# Patient Record
Sex: Male | Born: 1970 | Race: White | Hispanic: No | Smoking: Never smoker
Health system: Southern US, Community
[De-identification: ages and names within clinical notes are randomized; demographics above are authoritative.]

## PROBLEM LIST (undated history)

## (undated) DIAGNOSIS — B169 Acute hepatitis B without delta-agent and without hepatic coma: Secondary | ICD-10-CM

## (undated) DIAGNOSIS — E782 Mixed hyperlipidemia: Secondary | ICD-10-CM

## (undated) DIAGNOSIS — I10 Essential (primary) hypertension: Secondary | ICD-10-CM

## (undated) DIAGNOSIS — E291 Testicular hypofunction: Secondary | ICD-10-CM

## (undated) DIAGNOSIS — F5101 Primary insomnia: Secondary | ICD-10-CM

## (undated) DIAGNOSIS — F1020 Alcohol dependence, uncomplicated: Secondary | ICD-10-CM

## (undated) HISTORY — DX: Essential (primary) hypertension: I10

## (undated) HISTORY — DX: Acute hepatitis B without delta-agent and without hepatic coma: B16.9

## (undated) HISTORY — DX: Mixed hyperlipidemia: E78.2

## (undated) HISTORY — DX: Primary insomnia: F51.01

## (undated) HISTORY — DX: Alcohol dependence, uncomplicated: F10.20

## (undated) HISTORY — PX: HERNIA REPAIR: SHX51

## (undated) HISTORY — DX: Testicular hypofunction: E29.1

---

## 1997-12-14 ENCOUNTER — Emergency Department (HOSPITAL_COMMUNITY): Admission: EM | Admit: 1997-12-14 | Discharge: 1997-12-14 | Payer: Self-pay | Admitting: Emergency Medicine

## 2017-11-06 DIAGNOSIS — E782 Mixed hyperlipidemia: Secondary | ICD-10-CM | POA: Diagnosis not present

## 2017-11-06 DIAGNOSIS — E291 Testicular hypofunction: Secondary | ICD-10-CM | POA: Diagnosis not present

## 2018-06-03 DIAGNOSIS — E782 Mixed hyperlipidemia: Secondary | ICD-10-CM | POA: Diagnosis not present

## 2018-06-03 DIAGNOSIS — E291 Testicular hypofunction: Secondary | ICD-10-CM | POA: Diagnosis not present

## 2018-06-03 DIAGNOSIS — R740 Nonspecific elevation of levels of transaminase and lactic acid dehydrogenase [LDH]: Secondary | ICD-10-CM | POA: Diagnosis not present

## 2018-06-03 DIAGNOSIS — R030 Elevated blood-pressure reading, without diagnosis of hypertension: Secondary | ICD-10-CM | POA: Diagnosis not present

## 2018-06-24 DIAGNOSIS — Z205 Contact with and (suspected) exposure to viral hepatitis: Secondary | ICD-10-CM | POA: Diagnosis not present

## 2018-12-10 DIAGNOSIS — M25512 Pain in left shoulder: Secondary | ICD-10-CM | POA: Diagnosis not present

## 2018-12-10 DIAGNOSIS — I10 Essential (primary) hypertension: Secondary | ICD-10-CM | POA: Diagnosis not present

## 2018-12-10 DIAGNOSIS — E782 Mixed hyperlipidemia: Secondary | ICD-10-CM | POA: Diagnosis not present

## 2018-12-10 DIAGNOSIS — E291 Testicular hypofunction: Secondary | ICD-10-CM | POA: Diagnosis not present

## 2019-10-05 ENCOUNTER — Other Ambulatory Visit: Payer: Self-pay

## 2019-10-19 ENCOUNTER — Other Ambulatory Visit: Payer: Self-pay

## 2019-11-06 ENCOUNTER — Other Ambulatory Visit: Payer: Self-pay

## 2019-11-06 MED ORDER — TERBINAFINE HCL 250 MG PO TABS: 250.0000 mg | ORAL_TABLET | Freq: Every day | ORAL | 0 refills | Status: DC

## 2019-11-06 MED ORDER — LISINOPRIL 5 MG PO TABS: 5.0000 mg | ORAL_TABLET | Freq: Every day | ORAL | 0 refills | Status: DC

## 2019-11-10 ENCOUNTER — Other Ambulatory Visit: Payer: Self-pay

## 2019-11-10 MED ORDER — ZOLPIDEM TARTRATE 10 MG PO TABS: 10.0000 mg | ORAL_TABLET | Freq: Every evening | ORAL | 0 refills | Status: DC | PRN

## 2019-11-10 NOTE — Telephone Encounter (Signed)
I believe pt was abstracted incorrectly. His name is David Murphy. Please reenter. Pt is also due for 6 month follow up in April 2021. I will send one month of rx, but then needs an appt. Thanks, [facility]

## 2019-12-08 ENCOUNTER — Encounter: Payer: Self-pay | Admitting: Family Medicine

## 2019-12-08 DIAGNOSIS — I10 Essential (primary) hypertension: Secondary | ICD-10-CM | POA: Insufficient documentation

## 2019-12-08 DIAGNOSIS — B169 Acute hepatitis B without delta-agent and without hepatic coma: Secondary | ICD-10-CM | POA: Insufficient documentation

## 2019-12-08 DIAGNOSIS — E782 Mixed hyperlipidemia: Secondary | ICD-10-CM | POA: Insufficient documentation

## 2019-12-08 DIAGNOSIS — E291 Testicular hypofunction: Secondary | ICD-10-CM | POA: Insufficient documentation

## 2019-12-08 NOTE — Progress Notes (Signed)
Subjective:  Patient ID: David Murphy, male    DOB: Jan 05, 1971  Age: 49 y.o. MRN: [id number]  Chief Complaint  Patient presents with  . Hypertension  . Hyperlipidemia   HPI Pt presents with hyperlipidemia.  Date of diagnosis 07/21/2015.  Current treatment includes Lipitor.  Compliance with treatment has been good; he takes his medication as directed, maintains his low cholesterol diet, follows up as directed, and maintains his exercise regimen.  He denies experiencing any hypercholesterolemia related symptoms.  Most recent lab tests include total cholesterol level ( fasting 189 mg/dl on 2/67/1245 ), triglycerides ( 141 mg/dl ), HDL ( 51 mg/dL ), and LDL cholesterol ( 110 mg/dl ).  Concurrent health problems include hypertension.      Pt presents for follow up of hypertension.  Date of diagnosis 05/2018.  His current cardiac medication regimen includes an ACE inhibitor ( Lisinopril 5 mg QD prn ).  He has not kept a blood pressure diary, but states that typical readings show systolics in the 120-140 range and diastolics in the 70-80 range.  He is tolerating the medication well without side effects.  Compliance with treatment has been good; he maintains his diet and exercise regimen and follows up as directed.  States he only takes the Lisinopril if his blood pressure is elevated.  Concurrent health problems include hyperlipidemia.      Concerning primary insomnia, this has been noted for the past several months.  Sleep has been disrupted by a delay in initiation of sleep and frequent awakenings.  On average, he estimates that he gets 4-5 hours of sleep per night.  No associated symptoms are reported.  There are no current identifiable stressors.  Pertinent medical history is noncontributory.  See medication list for a list of his current prescriptions.  Remedies that he has already tried include Ambien 10 mg QHS, which is working great..      Additionally, he presents with history of tinea unguium.   this has been bothering him for the past several months.  The location is the right great toenail.  Associated symptoms include thick, discolored nail. He has been treating the nail with soaks and nail trimming. He was taking terbinafine, but was due for an appointment for labs.      Past Medical History:  Diagnosis Date  . 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 Alcoholic (HCC)   . Essential hypertension   . Mixed hyperlipidemia   . Primary insomnia   . Testicular hypofunction    Past Surgical History:  Procedure Laterality Date  . HERNIA REPAIR      Family History  Problem Relation Age of Onset  . Hyperlipidemia Mother   . Diabetes Mother   . Hyperlipidemia Father    Social History   Socioeconomic History  . Marital status: Divorced    Spouse name: Not on file  . Number of children: 1  . Years of education: Not on file  . Highest education level: Not on file  Occupational History  . Occupation: Civil Service fast streamer    Comment: [employer]  Tobacco Use  . Smoking status: Never Smoker  . Smokeless tobacco: Never Used  Substance and Sexual Activity  . Alcohol use: Yes    Comment: Alcoholic, currently not in treatment  . Drug use: Never  . Sexual activity: Not on file  Other Topics Concern  . Not on file  Social History Narrative  . Not on file   Social Determinants of  Health   Financial Resource Strain:   . Difficulty of Paying Living Expenses:   Food Insecurity:   . Worried About Programme researcher, broadcasting/film/video in the Last Year:   . Barista in the Last Year:   Transportation Needs:   . Freight forwarder (Medical):   Marland Kitchen Lack of Transportation (Non-Medical):   Physical Activity:   . Days of Exercise per Week:   . Minutes of Exercise per Session:   Stress:   . Feeling of Stress :   Social Connections:   . Frequency of Communication with Friends and Family:   . Frequency of Social Gatherings with Friends and Family:   . Attends Religious  Services:   . Active Member of Clubs or Organizations:   . Attends Banker Meetings:   Marland Kitchen Marital Status:     Review of Systems  Constitutional: Negative for chills, diaphoresis, fatigue and fever.  HENT: Negative for congestion, ear pain and sore throat.   Respiratory: Negative for cough and shortness of breath.   Cardiovascular: Negative for chest pain and leg swelling.  Gastrointestinal: Negative for abdominal pain, constipation, diarrhea, nausea and vomiting.  Genitourinary: Negative for dysuria and urgency.  Musculoskeletal: Negative for arthralgias and myalgias.  Neurological: Negative for dizziness and headaches.  Psychiatric/Behavioral: Negative for dysphoric mood. The patient is nervous/anxious.    Objective:  BP (!) 140/100   Pulse 94   Temp 98.2 F (36.8 C)   Ht 6' (1.829 m)   Wt 213 lb (96.6 kg)   SpO2 99%   BMI 28.89 kg/m   BP/Weight 12/10/2019  Systolic BP 140  Diastolic BP 161  Wt. (Lbs) 213  BMI 28.89    Physical Exam Constitutional:      Appearance: Normal appearance.  Neck:     Vascular: No carotid bruit.  Cardiovascular:     Rate and Rhythm: Normal rate and regular rhythm.     Pulses: Normal pulses.     Heart sounds: Normal heart sounds. No murmur.  Pulmonary:     Effort: Pulmonary effort is normal. No respiratory distress.     Breath sounds: Normal breath sounds.  Abdominal:     General: Bowel sounds are normal.     Palpations: Abdomen is soft.     Tenderness: There is no abdominal tenderness.  Musculoskeletal:        General: Normal range of motion.  Neurological:     Mental Status: He is alert and oriented to person, place, and time.  Psychiatric:        Mood and Affect: Mood normal.        Behavior: Behavior normal.     Lab Results  Component Value Date   WBC 7.9 12/10/2019   HGB 15.9 12/10/2019   HCT 45.5 12/10/2019   PLT 237 12/10/2019   GLUCOSE 85 12/10/2019   CHOL 183 12/10/2019   TRIG 129 12/10/2019   HDL 58  12/10/2019   LDLCALC 102 (H) 12/10/2019   ALT 117 (H) 12/10/2019   AST 53 (H) 12/10/2019   NA 142 12/10/2019   K 3.8 12/10/2019   CL 96 12/10/2019   CREATININE 1.01 12/10/2019   BUN 10 12/10/2019   CO2 25 12/10/2019   TSH 2.440 12/10/2019      Assessment & Plan:  1. Mixed hyperlipidemia Well controlled.  No changes to medicines.  Continue to work on eating a healthy diet and exercise.  Labs drawn today.  - Lipid panel  2.  Testicular hypofunction - Testosterone,Free and Total  3. 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 Recommend against alcohol.  4. Essential hypertension Well controlled.  No changes to medicines.  Continue to work on eating a healthy diet and exercise.  Labs drawn today.  - CBC with Differential/Platelet - Comprehensive metabolic panel - TSH  5. Tinea of nail Check LFTs.   6. Overweight with body mass index (BMI) of 28 to 28.9 in adult Recommend continue to work on eating healthy diet and exercise.   Meds ordered this encounter  Medications  . cyclobenzaprine (FLEXERIL) 5 MG tablet    Sig: Take 1 tablet (5 mg total) by mouth 2 (two) times daily as needed for muscle spasms.    Dispense:  60 tablet    Refill:  1  . lisinopril (ZESTRIL) 10 MG tablet    Sig: Take 1 tablet (10 mg total) by mouth daily.    Dispense:  90 tablet    Refill:  3    Orders Placed This Encounter  Procedures  . CBC with Differential/Platelet  . Comprehensive metabolic panel  . Lipid panel  . TSH  . Testosterone,Free and Total  . Cardiovascular Risk Assessment     Follow-up: Return in about 4 weeks (around 01/07/2020) for nurse visit for bp check and CMP.  Follow up in 3 months fasting. Marland Kitchen  An After Visit Summary was printed and given to the patient.  Rochel Brome [facility] [phone]

## 2019-12-10 ENCOUNTER — Encounter: Payer: Self-pay | Admitting: Family Medicine

## 2019-12-10 ENCOUNTER — Ambulatory Visit (INDEPENDENT_AMBULATORY_CARE_PROVIDER_SITE_OTHER): Payer: Commercial Managed Care - PPO | Admitting: Family Medicine

## 2019-12-10 ENCOUNTER — Other Ambulatory Visit: Payer: Self-pay

## 2019-12-10 VITALS — BP 140/100 | HR 94 | Temp 98.2°F | Ht 72.0 in | Wt 213.0 lb

## 2019-12-10 DIAGNOSIS — Z6828 Body mass index (BMI) 28.0-28.9, adult: Secondary | ICD-10-CM

## 2019-12-10 DIAGNOSIS — B351 Tinea unguium: Secondary | ICD-10-CM | POA: Diagnosis not present

## 2019-12-10 DIAGNOSIS — E782 Mixed hyperlipidemia: Secondary | ICD-10-CM

## 2019-12-10 DIAGNOSIS — B169 Acute hepatitis B without delta-agent and without hepatic coma: Secondary | ICD-10-CM | POA: Diagnosis not present

## 2019-12-10 DIAGNOSIS — E291 Testicular hypofunction: Secondary | ICD-10-CM | POA: Diagnosis not present

## 2019-12-10 DIAGNOSIS — I10 Essential (primary) hypertension: Secondary | ICD-10-CM

## 2019-12-10 DIAGNOSIS — E663 Overweight: Secondary | ICD-10-CM

## 2019-12-10 MED ORDER — CYCLOBENZAPRINE HCL 5 MG PO TABS
5.0000 mg | ORAL_TABLET | Freq: Two times a day (BID) | ORAL | 1 refills | Status: DC | PRN
Start: 2019-12-10 — End: 2020-05-25

## 2019-12-10 MED ORDER — LISINOPRIL 10 MG PO TABS
10.0000 mg | ORAL_TABLET | Freq: Every day | ORAL | 3 refills | Status: DC
Start: 2019-12-10 — End: 2020-09-28

## 2019-12-10 NOTE — Patient Instructions (Addendum)
Hypertension - increase lisinopril to 10 mg once daily.  Follow up in 1 month for bp check and labwork.  Back pain - rx for flexeril 5 mg one twice a day as needed for muscle spasms.   Thoracic Strain Rehab Ask your health care provider which exercises are safe for you. Do exercises exactly as told by your health care provider and adjust them as directed. It is normal to feel mild stretching, pulling, tightness, or discomfort as you do these exercises. Stop right away if you feel sudden pain or your pain gets worse. Do not begin these exercises until told by your health care provider. Stretching and range-of-motion exercise This exercise warms up your muscles and joints and improves the movement and flexibility of your back and shoulders. This exercise also helps to relieve pain. Chest and spine stretch  1. Lie down on your back on a firm surface. 2. Roll a towel or a small blanket so it is about 4 inches (10 cm) in diameter. 3. Put the towel lengthwise under the middle of your back so it is under your spine, but not under your shoulder blades. 4. Put your hands behind your head and let your elbows fall to your sides. This will increase your stretch. 5. Take a deep breath (inhale). 6. Hold for __________ seconds. 7. Relax after you breathe out (exhale). Repeat __________ times. Complete this exercise __________ times a day. Strengthening exercises These exercises build strength and endurance in your back and your shoulder blade muscles. Endurance is the ability to use your muscles for a long time, even after they get tired. Alternating arm and leg raises  1. Get on your hands and knees on a firm surface. If you are on a hard floor, you may want to use padding, such as an exercise mat, to cushion your knees. 2. Line up your arms and legs. Your hands should be directly below your shoulders, and your knees should be directly below your hips. 3. Lift your left leg behind you. At the same time,  raise your right arm and straighten it in front of you. ? Do not lift your leg higher than your hip. ? Do not lift your arm higher than your shoulder. ? Keep your abdominal and back muscles tight. ? Keep your hips facing the ground. ? Do not arch your back. ? Keep your balance carefully, and do not hold your breath. 4. Hold for __________ seconds. 5. Slowly return to the starting position and repeat with your right leg and your left arm. Repeat __________ times. Complete this exercise __________ times a day. Straight arm rows This exercise is also called shoulder extension exercise. 1. Stand with your feet shoulder width apart. 2. Secure an exercise band to a stable object in front of you so the band is at or above shoulder height. 3. Hold one end of the exercise band in each hand. 4. Straighten your elbows and lift your hands up to shoulder height. 5. Step back, away from the secured end of the exercise band, until the band stretches. 6. Squeeze your shoulder blades together and pull your hands down to the sides of your thighs. Stop when your hands are straight down by your sides. This is shoulder extension. Do not let your hands go behind your body. 7. Hold for __________ seconds. 8. Slowly return to the starting position. Repeat __________ times. Complete this exercise __________ times a day. Prone shoulder external rotation 1. Lie on your abdomen on a  firm bed so your left / right forearm hangs over the edge of the bed and your upper arm is on the bed, straight out from your body. This is the prone position. ? Your elbow should be bent. ? Your palm should be facing your feet. 2. If instructed, hold a __________ weight in your hand. 3. Squeeze your shoulder blade toward the middle of your back. Do not let your shoulder lift toward your ear. 4. Keep your elbow bent in a 90-degree angle (right angle) while you slowly move your forearm up toward the ceiling. Move your forearm up to the  height of the bed, toward your head. This is external rotation. ? Your upper arm should not move. ? At the top of the movement, your palm should face the floor. 5. Hold for __________ seconds. 6. Slowly return to the starting position and relax your muscles. Repeat __________ times. Complete this exercise __________ times a day. Rowing scapular retraction This is an exercise in which the shoulder blades (scapulae) are pulled toward each other (retraction). 1. Sit in a stable chair without armrests, or stand up. 2. Secure an exercise band to a stable object in front of you so the band is at shoulder height. 3. Hold one end of the exercise band in each hand. Your palms should face down. 4. Bring your arms out straight in front of you. 5. Step back, away from the secured end of the exercise band, until the band stretches. 6. Pull the band backward. As you do this, bend your elbows and squeeze your shoulder blades together, but avoid letting the rest of your body move. Do not shrug your shoulders upward while you do this. 7. Stop when your elbows are at your sides or slightly behind your body. 8. Hold for __________ seconds. 9. Slowly straighten your arms to return to the starting position. Repeat __________ times. Complete this exercise __________ times a day. Posture and body mechanics Good posture and healthy body mechanics can help to relieve stress in your body's tissues and joints. Body mechanics refers to the movements and positions of your body while you do your daily activities. Posture is part of body mechanics. Good posture means:  Your spine is in its natural S-curve position (neutral).  Your shoulders are pulled back slightly.  Your head is not tipped forward. Follow these guidelines to improve your posture and body mechanics in your everyday activities. Standing   When standing, keep your spine neutral and your feet about hip width apart. Keep a slight bend in your knees. Your  ears, shoulders, and hips should line up with each other.  When you do a task in which you lean forward while standing in one place for a long time, place one foot up on a stable object that is 2-4 inches (5-10 cm) high, such as a footstool. This helps keep your spine neutral. Sitting   When sitting, keep your spine neutral and keep your feet flat on the floor. Use a footrest, if necessary, and keep your thighs parallel to the floor. Avoid rounding your shoulders, and avoid tilting your head forward.  When working at a desk or a computer, keep your desk at a height where your hands are slightly lower than your elbows. Slide your chair under your desk so you are close enough to maintain good posture.  When working at a computer, place your monitor at a height where you are looking straight ahead and you do not have to tilt  your head forward or downward to look at the screen. Resting When lying down and resting, avoid positions that are most painful for you.  If you have pain with activities such as sitting, bending, stooping, or squatting (flexion-basedactivities), lie in a position in which your body does not bend very much. For example, avoid curling up on your side with your arms and knees near your chest (fetal position).  If you have pain with activities such as standing for a long time or reaching with your arms (extension-basedactivities), lie with your spine in a neutral position and bend your knees slightly. Try the following positions: ? Lie on your side with a pillow between your knees. ? Lie on your back with a pillow under your knees.  Lifting   When lifting objects, keep your feet at least shoulder width apart and tighten your abdominal muscles.  Bend your knees and hips and keep your spine neutral. It is important to lift using the strength of your legs, not your back. Do not lock your knees straight out.  Always ask for help to lift heavy or awkward objects. This  information is not intended to replace advice given to you by your health care provider. Make sure you discuss any questions you have with your health care provider. Document Revised: 11/21/2018 Document Reviewed: 09/08/2018 Elsevier Patient Education  2020 [employer].

## 2019-12-12 LAB — TSH: TSH: 2.44 u[IU]/mL (ref 0.450–4.500)

## 2019-12-12 LAB — CBC WITH DIFFERENTIAL/PLATELET
Basophils Absolute: 0.1 10*3/uL (ref 0.0–0.2)
Basos: 1 %
EOS (ABSOLUTE): 0.2 10*3/uL (ref 0.0–0.4)
Eos: 3 %
Hematocrit: 45.5 % (ref 37.5–51.0)
Hemoglobin: 15.9 g/dL (ref 13.0–17.7)
Immature Grans (Abs): 0 10*3/uL (ref 0.0–0.1)
Immature Granulocytes: 0 %
Lymphocytes Absolute: 1.8 10*3/uL (ref 0.7–3.1)
Lymphs: 23 %
MCH: 31.2 pg (ref 26.6–33.0)
MCHC: 34.9 g/dL (ref 31.5–35.7)
MCV: 89 fL (ref 79–97)
Monocytes Absolute: 0.6 10*3/uL (ref 0.1–0.9)
Monocytes: 8 %
Neutrophils Absolute: 5.2 10*3/uL (ref 1.4–7.0)
Neutrophils: 65 %
Platelets: 237 10*3/uL (ref 150–450)
RBC: 5.09 x10E6/uL (ref 4.14–5.80)
RDW: 12.5 % (ref 11.6–15.4)
WBC: 7.9 10*3/uL (ref 3.4–10.8)

## 2019-12-12 LAB — COMPREHENSIVE METABOLIC PANEL
ALT: 117 IU/L — ABNORMAL HIGH (ref 0–44)
AST: 53 IU/L — ABNORMAL HIGH (ref 0–40)
Albumin/Globulin Ratio: 1.9 (ref 1.2–2.2)
Albumin: 5 g/dL (ref 4.0–5.0)
Alkaline Phosphatase: 95 IU/L (ref 39–117)
BUN/Creatinine Ratio: 10 (ref 9–20)
BUN: 10 mg/dL (ref 6–24)
Bilirubin Total: 0.6 mg/dL (ref 0.0–1.2)
CO2: 25 mmol/L (ref 20–29)
Calcium: 9.7 mg/dL (ref 8.7–10.2)
Chloride: 96 mmol/L (ref 96–106)
Creatinine, Ser: 1.01 mg/dL (ref 0.76–1.27)
GFR calc Af Amer: 102 mL/min/{1.73_m2} (ref >59)
GFR calc non Af Amer: 88 mL/min/{1.73_m2} (ref >59)
Globulin, Total: 2.6 g/dL (ref 1.5–4.5)
Glucose: 85 mg/dL (ref 65–99)
Potassium: 3.8 mmol/L (ref 3.5–5.2)
Sodium: 142 mmol/L (ref 134–144)
Total Protein: 7.6 g/dL (ref 6.0–8.5)

## 2019-12-12 LAB — TESTOSTERONE,FREE AND TOTAL
Testosterone, Free: 11.6 pg/mL (ref 6.8–21.5)
Testosterone: 478 ng/dL (ref 264–916)

## 2019-12-12 LAB — LIPID PANEL
Chol/HDL Ratio: 3.2 ratio (ref 0.0–5.0)
Cholesterol, Total: 183 mg/dL (ref 100–199)
HDL: 58 mg/dL (ref >39)
LDL Chol Calc (NIH): 102 mg/dL — ABNORMAL HIGH (ref 0–99)
Triglycerides: 129 mg/dL (ref 0–149)
VLDL Cholesterol Cal: 23 mg/dL (ref 5–40)

## 2019-12-12 LAB — CARDIOVASCULAR RISK ASSESSMENT

## 2019-12-13 DIAGNOSIS — B351 Tinea unguium: Secondary | ICD-10-CM | POA: Insufficient documentation

## 2019-12-13 DIAGNOSIS — Z6828 Body mass index (BMI) 28.0-28.9, adult: Secondary | ICD-10-CM | POA: Insufficient documentation

## 2019-12-13 DIAGNOSIS — E663 Overweight: Secondary | ICD-10-CM | POA: Insufficient documentation

## 2020-01-06 ENCOUNTER — Other Ambulatory Visit: Payer: Self-pay

## 2020-01-06 ENCOUNTER — Ambulatory Visit: Payer: Commercial Managed Care - PPO

## 2020-01-06 VITALS — BP 130/78 | HR 82 | Temp 97.7°F | Resp 16

## 2020-01-06 DIAGNOSIS — I10 Essential (primary) hypertension: Secondary | ICD-10-CM

## 2020-01-06 NOTE — Progress Notes (Signed)
Patient came in today for a blood pressure check.  Syd states that he is taking his medication as directed and has slowed down on his drinking as Dr.Cox advised.  He is tolerating the medication well and has not complaints of side effects.

## 2020-01-20 ENCOUNTER — Other Ambulatory Visit: Payer: Self-pay | Admitting: Family Medicine

## 2020-01-21 ENCOUNTER — Other Ambulatory Visit: Payer: Self-pay

## 2020-01-21 MED ORDER — TESTOSTERONE 20.25 MG/1.25GM (1.62%) TD GEL: TRANSDERMAL | 5 refills | Status: DC

## 2020-01-21 MED ORDER — ATORVASTATIN CALCIUM 10 MG PO TABS: 10.0000 mg | ORAL_TABLET | Freq: Every day | ORAL | 0 refills | Status: DC

## 2020-02-19 ENCOUNTER — Other Ambulatory Visit: Payer: Self-pay | Admitting: Physician Assistant

## 2020-02-19 ENCOUNTER — Other Ambulatory Visit: Payer: Self-pay | Admitting: Family Medicine

## 2020-02-22 ENCOUNTER — Other Ambulatory Visit: Payer: Self-pay

## 2020-02-22 MED ORDER — TESTOSTERONE 20.25 MG/1.25GM (1.62%) TD GEL: TRANSDERMAL | 5 refills | Status: DC

## 2020-03-14 ENCOUNTER — Encounter: Payer: Self-pay | Admitting: Family Medicine

## 2020-03-14 ENCOUNTER — Ambulatory Visit (INDEPENDENT_AMBULATORY_CARE_PROVIDER_SITE_OTHER): Payer: Commercial Managed Care - PPO | Admitting: Family Medicine

## 2020-03-14 ENCOUNTER — Other Ambulatory Visit: Payer: Self-pay

## 2020-03-14 VITALS — BP 134/76 | HR 111 | Temp 97.7°F | Ht 72.0 in | Wt 213.0 lb

## 2020-03-14 DIAGNOSIS — G8929 Other chronic pain: Secondary | ICD-10-CM

## 2020-03-14 DIAGNOSIS — E663 Overweight: Secondary | ICD-10-CM

## 2020-03-14 DIAGNOSIS — I10 Essential (primary) hypertension: Secondary | ICD-10-CM | POA: Diagnosis not present

## 2020-03-14 DIAGNOSIS — Z6828 Body mass index (BMI) 28.0-28.9, adult: Secondary | ICD-10-CM

## 2020-03-14 DIAGNOSIS — H65192 Other acute nonsuppurative otitis media, left ear: Secondary | ICD-10-CM | POA: Diagnosis not present

## 2020-03-14 DIAGNOSIS — M546 Pain in thoracic spine: Secondary | ICD-10-CM

## 2020-03-14 DIAGNOSIS — L600 Ingrowing nail: Secondary | ICD-10-CM | POA: Diagnosis not present

## 2020-03-14 DIAGNOSIS — E782 Mixed hyperlipidemia: Secondary | ICD-10-CM

## 2020-03-14 MED ORDER — AMOXICILLIN 875 MG PO TABS: 875.0000 mg | ORAL_TABLET | Freq: Two times a day (BID) | ORAL | 0 refills | Status: DC

## 2020-03-14 NOTE — Progress Notes (Signed)
Subjective:  Patient ID: David Murphy, male    DOB: February 17, 1971  Age: 49 y.o. MRN: [id number]  Chief Complaint  Patient presents with  . Hypertension  . Hyperlipidemia   HPI Pt presents with hyperlipidemia.  Date of diagnosis 07/21/2015.  Current treatment includes Lipitor.  Compliance with treatment has been good; he takes his medication as directed, maintains his low cholesterol diet, follows up as directed, and maintains his exercise regimen.  He denies experiencing any hypercholesterolemia related symptoms.  Concurrent health problems include hypertension.      Pt presents for follow up of hypertension.  Date of diagnosis 05/2018.  His current cardiac medication regimen includes an ACE inhibitor ( Lisinopril 10 mg QD.)  He has not kept a blood pressure diary, but states that typical readings show systolics in the 120-140 range and diastolics in the 70-80 range.  He is tolerating the medication well without side effects.  Compliance with treatment has been good; he maintains his diet and exercise regimen and follows up as directed.  States he only takes the Lisinopril if his blood pressure is elevated.  Concurrent health problems include hyperlipidemia.      Concerning primary insomnia, this has been noted for the past several months.  Sleep has been disrupted by a delay in initiation of sleep and frequent awakenings.  On average, he estimates that he gets 4-5 hours of sleep per night.  No associated symptoms are reported.  There are no current identifiable stressors.  Pertinent medical history is noncontributory.  See medication list for a list of his current prescriptions.  Remedies that he has already tried include Ambien 10 mg QHS, which is working great..      Additionally, he presents with history of tinea unguium.  this has been bothering him for the past several months.  The location is the right great toenail.  Associated symptoms include thick, discolored nail. He has been treating  the nail with soaks and nail trimming. He was taking terbinafine, but was due for an appointment for labs.      Past Medical History:  Diagnosis Date  . 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 Alcoholic (HCC)   . Essential hypertension   . Mixed hyperlipidemia   . Primary insomnia   . Testicular hypofunction    Past Surgical History:  Procedure Laterality Date  . HERNIA REPAIR      Family History  Problem Relation Age of Onset  . Hyperlipidemia Mother   . Diabetes Mother   . Hyperlipidemia Father    Social History   Socioeconomic History  . Marital status: Divorced    Spouse name: Not on file  . Number of children: 1  . Years of education: Not on file  . Highest education level: Not on file  Occupational History  . Occupation: Civil Service fast streamer    Comment: [employer]  Tobacco Use  . Smoking status: Never Smoker  . Smokeless tobacco: Never Used  Substance and Sexual Activity  . Alcohol use: Yes    Comment: Alcoholic, currently not in treatment  . Drug use: Never  . Sexual activity: Not on file  Other Topics Concern  . Not on file  Social History Narrative  . Not on file   Social Determinants of Health   Financial Resource Strain:   . Difficulty of Paying Living Expenses:   Food Insecurity:   . Worried About Programme researcher, broadcasting/film/video in the Last Year:   . Ran  Out of Food in the Last Year:   Transportation Needs:   . Lack of Transportation (Medical):   Marland Kitchen Lack of Transportation (Non-Medical):   Physical Activity:   . Days of Exercise per Week:   . Minutes of Exercise per Session:   Stress:   . Feeling of Stress :   Social Connections:   . Frequency of Communication with Friends and Family:   . Frequency of Social Gatherings with Friends and Family:   . Attends Religious Services:   . Active Member of Clubs or Organizations:   . Attends Banker Meetings:   Marland Kitchen Marital Status:     Review of Systems  Constitutional: Negative  for chills, diaphoresis, fatigue and fever.  HENT: Positive for ear pain (BL). Negative for congestion and sore throat.   Respiratory: Negative for cough and shortness of breath.   Cardiovascular: Negative for chest pain and leg swelling.  Gastrointestinal: Negative for abdominal pain, constipation, diarrhea, nausea and vomiting.  Endocrine: Positive for polydipsia. Negative for polyphagia and polyuria.  Genitourinary: Negative for dysuria and urgency.  Musculoskeletal: Positive for arthralgias (BL shoulder) and myalgias (posterior left trapezius tender medially.).  Neurological: Negative for dizziness and headaches.  Psychiatric/Behavioral: Negative for dysphoric mood. The patient is not nervous/anxious.    Objective:  BP 134/76   Pulse (!) 111   Temp 97.7 F (36.5 C)   Ht 6' (1.829 m)   Wt 213 lb (96.6 kg)   SpO2 99%   BMI 28.89 kg/m   BP/Weight 03/14/2020 01/06/2020 12/10/2019  Systolic BP 134 130 140  Diastolic BP 76 78 100  Wt. (Lbs) 213 - 213  BMI 28.89 - 28.89    Physical Exam Constitutional:      Appearance: Normal appearance.  HENT:     Right Ear: Tympanic membrane and ear canal normal.     Left Ear: Ear canal normal.     Ears:     Comments: Left TM erythema.    Nose: Nose normal.     Mouth/Throat:     Mouth: Mucous membranes are moist.     Pharynx: No posterior oropharyngeal erythema.  Neck:     Vascular: No carotid bruit.  Cardiovascular:     Rate and Rhythm: Normal rate and regular rhythm.     Pulses: Normal pulses.     Heart sounds: Normal heart sounds. No murmur heard.   Pulmonary:     Effort: Pulmonary effort is normal. No respiratory distress.     Breath sounds: Normal breath sounds.  Abdominal:     General: Bowel sounds are normal.     Palpations: Abdomen is soft.     Tenderness: There is no abdominal tenderness.  Musculoskeletal:        General: Tenderness (trigger point left medial trapezius.) present. Normal range of motion.  Skin:     Comments: Rt great toenail tender over cuticles.   Neurological:     Mental Status: He is alert and oriented to person, place, and time.  Psychiatric:        Mood and Affect: Mood normal.        Behavior: Behavior normal.     Lab Results  Component Value Date   WBC 9.7 03/15/2020   HGB 16.2 03/15/2020   HCT 48.4 03/15/2020   PLT 244 03/15/2020   GLUCOSE 87 03/15/2020   CHOL 211 (H) 03/15/2020   TRIG 115 03/15/2020   HDL 59 03/15/2020   LDLCALC 132 (H) 03/15/2020   ALT  113 (H) 03/15/2020   AST 45 (H) 03/15/2020   NA 137 03/15/2020   K 4.9 03/15/2020   CL 97 03/15/2020   CREATININE 1.07 03/15/2020   BUN 13 03/15/2020   CO2 26 03/15/2020   TSH 2.440 12/10/2019      Assessment & Plan:  1. Mixed hyperlipidemia Well controlled.  No changes to medicines.  Continue to work on eating a healthy diet and exercise.  Labs drawn today.  - Lipid panel  2. Essential hypertension Well controlled.  No changes to medicines.  Continue to work on eating a healthy diet and exercise.  Labs drawn today.  - CBC with Differential/Platelet - Comprehensive metabolic panel   3. Overweight with body mass index (BMI) of 28 to 28.9 in adult Recommend continue to work on eating healthy diet and exercise.  4. Other non-recurrent acute nonsuppurative otitis media of left ear Start amoxicillin.  4. Ingrown nail of great toe of right foot Start amoxicillin. Return for excision.  5. Chronic left-sided thoracic back pain Risks were discussed including bleeding, infection, increase in sugars if diabetic, atrophy at site of injection, and increased pain.  After consent was obtained, using sterile technique the left medial trapezius was prepped with betadine and alcohol.  Steroid 20 mg and 5 ml plain Lidocaine was then injected and the needle withdrawn.  The procedure was well tolerated.   The patient is asked to continue to rest the joint for a few more days before resuming regular activities.   It may be more painful for the first 1-2 days.  Watch for fever, or increased swelling or persistent pain in the joint. Call or return to clinic prn if such symptoms occur or there is failure to improve as anticipated.  Meds ordered this encounter  Medications  . amoxicillin (AMOXIL) 875 MG tablet    Sig: Take 1 tablet (875 mg total) by mouth 2 (two) times daily.    Dispense:  20 tablet    Refill:  0  . triamcinolone acetonide (KENALOG-40) injection 20 mg    Orders Placed This Encounter  Procedures  . CBC with Differential/Platelet  . Comprehensive metabolic panel  . Lipid panel     Follow-up: Return in about 1 week (around 03/21/2020).  An After Visit Summary was printed and given to the patient.  Blane Ohara [facility] [phone]

## 2020-03-15 ENCOUNTER — Other Ambulatory Visit: Payer: Commercial Managed Care - PPO

## 2020-03-15 DIAGNOSIS — E782 Mixed hyperlipidemia: Secondary | ICD-10-CM

## 2020-03-15 DIAGNOSIS — I10 Essential (primary) hypertension: Secondary | ICD-10-CM

## 2020-03-16 ENCOUNTER — Other Ambulatory Visit: Payer: Self-pay

## 2020-03-16 LAB — COMPREHENSIVE METABOLIC PANEL
ALT: 113 IU/L — ABNORMAL HIGH (ref 0–44)
AST: 45 IU/L — ABNORMAL HIGH (ref 0–40)
Albumin/Globulin Ratio: 2.2 (ref 1.2–2.2)
Albumin: 5.2 g/dL — ABNORMAL HIGH (ref 4.0–5.0)
Alkaline Phosphatase: 90 IU/L (ref 48–121)
BUN/Creatinine Ratio: 12 (ref 9–20)
BUN: 13 mg/dL (ref 6–24)
Bilirubin Total: 0.7 mg/dL (ref 0.0–1.2)
CO2: 26 mmol/L (ref 20–29)
Calcium: 10.1 mg/dL (ref 8.7–10.2)
Chloride: 97 mmol/L (ref 96–106)
Creatinine, Ser: 1.07 mg/dL (ref 0.76–1.27)
GFR calc Af Amer: 95 mL/min/{1.73_m2} (ref >59)
GFR calc non Af Amer: 82 mL/min/{1.73_m2} (ref >59)
Globulin, Total: 2.4 g/dL (ref 1.5–4.5)
Glucose: 87 mg/dL (ref 65–99)
Potassium: 4.9 mmol/L (ref 3.5–5.2)
Sodium: 137 mmol/L (ref 134–144)
Total Protein: 7.6 g/dL (ref 6.0–8.5)

## 2020-03-16 LAB — CBC WITH DIFFERENTIAL/PLATELET
Basophils Absolute: 0.1 10*3/uL (ref 0.0–0.2)
Basos: 1 %
EOS (ABSOLUTE): 0 10*3/uL (ref 0.0–0.4)
Eos: 0 %
Hematocrit: 48.4 % (ref 37.5–51.0)
Hemoglobin: 16.2 g/dL (ref 13.0–17.7)
Immature Grans (Abs): 0.1 10*3/uL (ref 0.0–0.1)
Immature Granulocytes: 1 %
Lymphocytes Absolute: 1.3 10*3/uL (ref 0.7–3.1)
Lymphs: 13 %
MCH: 30.7 pg (ref 26.6–33.0)
MCHC: 33.5 g/dL (ref 31.5–35.7)
MCV: 92 fL (ref 79–97)
Monocytes Absolute: 0.8 10*3/uL (ref 0.1–0.9)
Monocytes: 8 %
Neutrophils Absolute: 7.5 10*3/uL — ABNORMAL HIGH (ref 1.4–7.0)
Neutrophils: 77 %
Platelets: 244 10*3/uL (ref 150–450)
RBC: 5.28 x10E6/uL (ref 4.14–5.80)
RDW: 12.3 % (ref 11.6–15.4)
WBC: 9.7 10*3/uL (ref 3.4–10.8)

## 2020-03-16 LAB — LIPID PANEL
Chol/HDL Ratio: 3.6 ratio (ref 0.0–5.0)
Cholesterol, Total: 211 mg/dL — ABNORMAL HIGH (ref 100–199)
HDL: 59 mg/dL (ref >39)
LDL Chol Calc (NIH): 132 mg/dL — ABNORMAL HIGH (ref 0–99)
Triglycerides: 115 mg/dL (ref 0–149)
VLDL Cholesterol Cal: 20 mg/dL (ref 5–40)

## 2020-03-16 LAB — CARDIOVASCULAR RISK ASSESSMENT

## 2020-03-16 MED ORDER — ATORVASTATIN CALCIUM 20 MG PO TABS: 20.0000 mg | ORAL_TABLET | Freq: Every day | ORAL | 0 refills | Status: DC

## 2020-03-17 LAB — HEPATITIS PANEL, ACUTE
Hep A IgM: NEGATIVE
Hep B C IgM: NEGATIVE
Hep C Virus Ab: 0.1 s/co ratio (ref 0.0–0.9)
Hepatitis B Surface Ag: NEGATIVE

## 2020-03-17 LAB — SPECIMEN STATUS REPORT

## 2020-03-20 MED ORDER — TRIAMCINOLONE ACETONIDE 40 MG/ML IJ SUSP
20.0000 mg | Freq: Once | INTRAMUSCULAR | Status: DC
Start: 2020-03-20 — End: 2021-10-16

## 2020-03-21 ENCOUNTER — Other Ambulatory Visit: Payer: Self-pay

## 2020-03-21 ENCOUNTER — Encounter: Payer: Self-pay | Admitting: Family Medicine

## 2020-03-21 ENCOUNTER — Ambulatory Visit (INDEPENDENT_AMBULATORY_CARE_PROVIDER_SITE_OTHER): Payer: Commercial Managed Care - PPO | Admitting: Family Medicine

## 2020-03-21 VITALS — BP 124/78 | HR 84 | Temp 97.3°F | Ht 72.0 in | Wt 211.4 lb

## 2020-03-21 DIAGNOSIS — L600 Ingrowing nail: Secondary | ICD-10-CM | POA: Diagnosis not present

## 2020-03-21 NOTE — Progress Notes (Signed)
Acute Office Visit  Subjective:    Patient ID: David Murphy, male    DOB: 07-05-1971, 49 y.o.   MRN: [id number]  Chief Complaint  Patient presents with  . Ingrown Toenail    HPI Patient is in today for right great toenail infected. Here for excision.Currently on antibiotics.  Past Medical History:  Diagnosis Date  . 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 Alcoholic (HCC)   . Essential hypertension   . Mixed hyperlipidemia   . Primary insomnia   . Testicular hypofunction     Past Surgical History:  Procedure Laterality Date  . HERNIA REPAIR      Family History  Problem Relation Age of Onset  . Hyperlipidemia Mother   . Diabetes Mother   . Hyperlipidemia Father     Social History   Socioeconomic History  . Marital status: Divorced    Spouse name: Not on file  . Number of children: 1  . Years of education: Not on file  . Highest education level: Not on file  Occupational History  . Occupation: Civil Service fast streamer    Comment: [employer]  Tobacco Use  . Smoking status: Never Smoker  . Smokeless tobacco: Never Used  Substance and Sexual Activity  . Alcohol use: Yes    Comment: Alcoholic, currently not in treatment  . Drug use: Never  . Sexual activity: Not on file  Other Topics Concern  . Not on file  Social History Narrative  . Not on file   Social Determinants of Health   Financial Resource Strain:   . Difficulty of Paying Living Expenses:   Food Insecurity:   . Worried About Programme researcher, broadcasting/film/video in the Last Year:   . Barista in the Last Year:   Transportation Needs:   . Freight forwarder (Medical):   Marland Kitchen Lack of Transportation (Non-Medical):   Physical Activity:   . Days of Exercise per Week:   . Minutes of Exercise per Session:   Stress:   . Feeling of Stress :   Social Connections:   . Frequency of Communication with Friends and Family:   . Frequency of Social Gatherings with Friends and Family:   .  Attends Religious Services:   . Active Member of Clubs or Organizations:   . Attends Banker Meetings:   Marland Kitchen Marital Status:   Intimate Partner Violence:   . Fear of Current or Ex-Partner:   . Emotionally Abused:   Marland Kitchen Physically Abused:   . Sexually Abused:     Outpatient Medications Prior to Visit  Medication Sig Dispense Refill  . atorvastatin (LIPITOR) 20 MG tablet Take 1 tablet (20 mg total) by mouth daily. 90 tablet 0  . cyclobenzaprine (FLEXERIL) 5 MG tablet Take 1 tablet (5 mg total) by mouth 2 (two) times daily as needed for muscle spasms. 60 tablet 1  . lisinopril (ZESTRIL) 10 MG tablet Take 1 tablet (10 mg total) by mouth daily. 90 tablet 3  . Testosterone 20.25 MG/1.25GM (1.62%) GEL Apply 3 pumps once daily to clean, dry, intact skin of shoulders and upper arms 75 g 5  . zolpidem (AMBIEN) 10 MG tablet TAKE 1 TABLET BY MOUTH AT BEDTIME AS NEEDED (PLEASE  CALL  DR  Roylee Chaffin  FOR  APPOINTMENT) 30 tablet 3  . amoxicillin (AMOXIL) 875 MG tablet Take 1 tablet (875 mg total) by mouth 2 (two) times daily. 20 tablet 0  . terbinafine (LAMISIL) 250 MG  tablet Take 1 tablet by mouth once daily 30 tablet 0   Facility-Administered Medications Prior to Visit  Medication Dose Route Frequency Provider Last Rate Last Admin  . triamcinolone acetonide (KENALOG-40) injection 20 mg  20 mg Intramuscular Once Clarke Peretz, Fritzi Mandes, MD        No Known Allergies  Review of Systems  Constitutional: Negative for fever.       Objective:    Physical Exam Vitals reviewed.  Constitutional:      Appearance: Normal appearance.  Skin:    Comments: Right great toe nail lateral infection. Tender.  Neurological:     Mental Status: He is alert.     BP 124/78   Pulse 84   Temp (!) 97.3 F (36.3 C)   Ht 6' (1.829 m)   Wt 211 lb 6.4 oz (95.9 kg)   BMI 28.67 kg/m  Wt Readings from Last 3 Encounters:  03/21/20 211 lb 6.4 oz (95.9 kg)  03/14/20 213 lb (96.6 kg)  12/10/19 213 lb (96.6 kg)     Health Maintenance Due  Topic Date Due  . COVID-19 Vaccine (1) Never done  . HIV Screening  Never done  . TETANUS/TDAP  Never done  . INFLUENZA VACCINE  03/13/2020    There are no preventive care reminders to display for this patient.   Lab Results  Component Value Date   TSH 2.440 12/10/2019   Lab Results  Component Value Date   WBC 9.7 03/15/2020   HGB 16.2 03/15/2020   HCT 48.4 03/15/2020   MCV 92 03/15/2020   PLT 244 03/15/2020   Lab Results  Component Value Date   NA 137 03/15/2020   K 4.9 03/15/2020   CO2 26 03/15/2020   GLUCOSE 87 03/15/2020   BUN 13 03/15/2020   CREATININE 1.07 03/15/2020   BILITOT 0.7 03/15/2020   ALKPHOS 90 03/15/2020   AST 45 (H) 03/15/2020   ALT 113 (H) 03/15/2020   PROT 7.6 03/15/2020   ALBUMIN 5.2 (H) 03/15/2020   CALCIUM 10.1 03/15/2020   Lab Results  Component Value Date   CHOL 211 (H) 03/15/2020   Lab Results  Component Value Date   HDL 59 03/15/2020   Lab Results  Component Value Date   LDLCALC 132 (H) 03/15/2020   Lab Results  Component Value Date   TRIG 115 03/15/2020   Lab Results  Component Value Date   CHOLHDL 3.6 03/15/2020   No results found for: HGBA1C     Assessment & Plan:  1. Ingrown nail of great toe of right foot Procedure: Tourniquet: rubber band applied and removed when procedure completed. Anesthesia: digital block - lidocaine without epinephrine  8 cc. Area cleansed with betadine.  Toe nail nippers cut in half and each side removed.  Pressure dressing applied.  No complications.  Education given.  Complete antibiotic.   Follow-up: Return if symptoms worsen or fail to improve.  An After Visit Summary was printed and given to the patient.  Blane Ohara [facility] [phone]

## 2020-03-21 NOTE — Patient Instructions (Signed)
Fingernail or Toenail Removal, Adult, Care After °This sheet gives you information about how to care for yourself after your procedure. Your health care provider may also give you more specific instructions. If you have problems or questions, contact your health care provider. °What can I expect after the procedure? °After the procedure, it is common to have: °· Pain. °· Redness. °· Swelling. °· Soreness. °Follow these instructions at home: °Medicines °· Take over-the-counter and prescription medicines only as told by your health care provider. °· If you were prescribed an antibiotic medicine, take or apply it as told by your health care provider. Do not stop using the antibiotic even if you start to feel better. °Wound care °· Follow instructions from your health care provider about how to take care of your wound. Make sure you: °? Wash your hands with soap and water for at least 20 seconds before and after you change your bandage (dressing). If soap and water are not available, use hand sanitizer. °? Change your dressing as told by your health care provider. °? Keep your dressing dry until your health care provider says it can be removed. °· Check your wound every day for signs of infection. Check for: °? More redness, swelling, or pain. °? More fluid or blood. °? Warmth. °? Pus or a bad smell. °If you have a splint: ° °· Wear the splint as told by your health care provider. Remove it only as told by your health care provider. °· Loosen the splint if your fingers tingle, become numb, or turn cold and blue. °· Keep the splint clean. °· If the splint is not waterproof: °? Do not let it get wet. °? Cover it with a watertight covering when you take a bath or a shower. °Managing pain, stiffness, and swelling °· Move your fingers or toes often to reduce stiffness and swelling. °· Raise (elevate) the injured area above the level of your heart while you are sitting or lying down. You may need to keep your hand or foot  raised or supported on a pillow for 24 hours or as told by your health care provider. °General instructions °· If you were given a shoe to wear, wear it as told by your health care provider. °· Keep all follow-up visits as told by your health care provider. This is important. °Contact a health care provider if: °· You have more redness, swelling, or pain around your wound. °· You have more fluid or blood coming from your wound. °· You have pus or a bad smell coming from your wound. °· Your wound feels warm to the touch. °· You have a fever. °Get help right away if: °· Your finger or toe looks pale, blue, or black. °· You are not able to move your finger or toe. °Summary °· After the procedure, it is common to have pain and swelling. °· Keep the hand or foot raised (elevated) or supported on a pillow as told by your health care provider. °· Take over-the-counter and prescription medicines only as told by your health care provider. °· Check your wound every day for signs of infection. °This information is not intended to replace advice given to you by your health care provider. Make sure you discuss any questions you have with your health care provider. °Document Revised: 03/23/2019 Document Reviewed: 03/23/2019 °Elsevier Patient Education © 2020 [employer] ° °

## 2020-03-25 ENCOUNTER — Ambulatory Visit: Payer: Commercial Managed Care - PPO | Admitting: Family Medicine

## 2020-05-25 ENCOUNTER — Other Ambulatory Visit: Payer: Self-pay | Admitting: Family Medicine

## 2020-07-19 ENCOUNTER — Other Ambulatory Visit: Payer: Self-pay | Admitting: Family Medicine

## 2020-09-03 ENCOUNTER — Other Ambulatory Visit: Payer: Self-pay | Admitting: Family Medicine

## 2020-09-03 ENCOUNTER — Other Ambulatory Visit: Payer: Self-pay | Admitting: Legal Medicine

## 2020-09-03 DIAGNOSIS — E291 Testicular hypofunction: Secondary | ICD-10-CM

## 2020-09-08 ENCOUNTER — Other Ambulatory Visit: Payer: Self-pay | Admitting: Family Medicine

## 2020-09-28 ENCOUNTER — Other Ambulatory Visit: Payer: Self-pay

## 2020-09-28 MED ORDER — LISINOPRIL 10 MG PO TABS: 10.0000 mg | ORAL_TABLET | Freq: Every day | ORAL | 0 refills | Status: DC

## 2020-10-31 ENCOUNTER — Other Ambulatory Visit: Payer: Self-pay | Admitting: Family Medicine

## 2020-12-16 ENCOUNTER — Other Ambulatory Visit: Payer: Self-pay

## 2020-12-16 ENCOUNTER — Ambulatory Visit (INDEPENDENT_AMBULATORY_CARE_PROVIDER_SITE_OTHER): Payer: Commercial Managed Care - PPO | Admitting: Family Medicine

## 2020-12-16 VITALS — BP 148/100 | HR 100 | Temp 97.6°F | Resp 18 | Ht 72.0 in | Wt 229.0 lb

## 2020-12-16 DIAGNOSIS — J301 Allergic rhinitis due to pollen: Secondary | ICD-10-CM

## 2020-12-16 DIAGNOSIS — E782 Mixed hyperlipidemia: Secondary | ICD-10-CM

## 2020-12-16 DIAGNOSIS — Z6828 Body mass index (BMI) 28.0-28.9, adult: Secondary | ICD-10-CM | POA: Diagnosis not present

## 2020-12-16 DIAGNOSIS — E291 Testicular hypofunction: Secondary | ICD-10-CM | POA: Diagnosis not present

## 2020-12-16 DIAGNOSIS — H6123 Impacted cerumen, bilateral: Secondary | ICD-10-CM

## 2020-12-16 DIAGNOSIS — I10 Essential (primary) hypertension: Secondary | ICD-10-CM | POA: Diagnosis not present

## 2020-12-16 MED ORDER — LISINOPRIL 20 MG PO TABS: 20.0000 mg | ORAL_TABLET | Freq: Every day | ORAL | 0 refills | Status: DC

## 2020-12-16 MED ORDER — PREDNISONE 50 MG PO TABS: 50.0000 mg | ORAL_TABLET | Freq: Every day | ORAL | 0 refills | Status: DC

## 2020-12-16 NOTE — Patient Instructions (Addendum)
Increase lisinopril to 20 mg once daily.  Continue other medicines as prescribed.  Recommend use debrox in both ears for one week. Prednisone 50 mg once daily x 5 days.

## 2020-12-16 NOTE — Progress Notes (Signed)
Subjective:  Patient ID: David Murphy, male    DOB: 01/22/1971  Age: 50 y.o. MRN: [id number]  Chief Complaint  Patient presents with  . Hypertension    HPI Hyperlipidemia: On lipitor 10 mg once daily.  Hypertension: on lisinopril 20 mg once daily. Insomnia: on ambien. h Hypogonadism: has been taking testosterone gel.   Current Outpatient Medications on File Prior to Visit  Medication Sig Dispense Refill  . atorvastatin (LIPITOR) 20 MG tablet Take 1 tablet by mouth once daily 90 tablet 0  . cyclobenzaprine (FLEXERIL) 5 MG tablet TAKE 1 TABLET BY MOUTH TWICE DAILY AS NEEDED FOR MUSCLE SPASM 60 tablet 0  . Testosterone 1.62 % GEL APPLY 3 PUMPS TOPICALLY ONCE DAILY TO CLEAN, DRY, INTACT SKIN OF SHOULDERS AND UPPER ARMS 75 g 2  . Testosterone 20.25 MG/1.25GM (1.62%) GEL Apply 3 pumps once daily to clean, dry, intact skin of shoulders and upper arms 75 g 5  . zolpidem (AMBIEN) 10 MG tablet TAKE 1 TABLET BY MOUTH AT BEDTIME AS NEEDED 30 tablet 2   Current Facility-Administered Medications on File Prior to Visit  Medication Dose Route Frequency Provider Last Rate Last Admin  . triamcinolone acetonide (KENALOG-40) injection 20 mg  20 mg Intramuscular Once Blane Ohara, MD       Past Medical History:  Diagnosis Date  . 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 Alcoholic (HCC)   . Essential hypertension   . Mixed hyperlipidemia   . Primary insomnia   . Testicular hypofunction    Past Surgical History:  Procedure Laterality Date  . HERNIA REPAIR      Family History  Problem Relation Age of Onset  . Hyperlipidemia Mother   . Diabetes Mother   . Hyperlipidemia Father    Social History   Socioeconomic History  . Marital status: Divorced    Spouse name: Not on file  . Number of children: 1  . Years of education: Not on file  . Highest education level: Not on file  Occupational History  . Occupation: Civil Service fast streamer    Comment: [employer]  Tobacco  Use  . Smoking status: Never Smoker  . Smokeless tobacco: Never Used  Substance and Sexual Activity  . Alcohol use: Yes    Comment: Alcoholic, currently not in treatment  . Drug use: Never  . Sexual activity: Not on file  Other Topics Concern  . Not on file  Social History Narrative  . Not on file   Social Determinants of Health   Financial Resource Strain: Not on file  Food Insecurity: Not on file  Transportation Needs: Not on file  Physical Activity: Not on file  Stress: Not on file  Social Connections: Not on file    Review of Systems  Constitutional: Negative for chills, fatigue and fever.  HENT: Positive for ear pain (fullness), rhinorrhea and sneezing. Negative for congestion and sore throat.   Respiratory: Negative for cough and shortness of breath.   Cardiovascular: Negative for chest pain.  Gastrointestinal: Negative for abdominal pain, constipation, diarrhea, nausea and vomiting.  Endocrine: Negative for polydipsia, polyphagia and polyuria.  Genitourinary: Negative for dysuria and frequency.  Musculoskeletal: Negative for arthralgias and myalgias.  Neurological: Negative for dizziness and headaches.  Psychiatric/Behavioral: Negative for dysphoric mood.       No dysphoria     Objective:  BP (!) 148/100   Pulse 100   Temp 97.6 F (36.4 C)   Resp 18   Ht 6' (  1.829 m)   Wt 229 lb (103.9 kg)   BMI 31.06 kg/m   BP/Weight 12/16/2020 03/21/2020 03/14/2020  Systolic BP 148 124 134  Diastolic BP 100 78 76  Wt. (Lbs) 229 211.4 213  BMI 31.06 28.67 28.89    Physical Exam Vitals reviewed.  Constitutional:      Appearance: Normal appearance. He is obese.  HENT:     Right Ear: Tympanic membrane normal.     Left Ear: Tympanic membrane normal.     Nose: Congestion present.     Comments: Nasal turbinates swollen.    Mouth/Throat:     Pharynx: No oropharyngeal exudate or posterior oropharyngeal erythema.  Neck:     Vascular: No carotid bruit.  Cardiovascular:      Rate and Rhythm: Normal rate and regular rhythm.     Pulses: Normal pulses.     Heart sounds: Normal heart sounds.  Pulmonary:     Effort: Pulmonary effort is normal.     Breath sounds: Normal breath sounds. No wheezing, rhonchi or rales.  Abdominal:     General: Bowel sounds are normal.     Palpations: Abdomen is soft.     Tenderness: There is no abdominal tenderness.  Neurological:     Mental Status: He is alert.  Psychiatric:        Mood and Affect: Mood normal.        Behavior: Behavior normal.     Diabetic Foot Exam - Simple   No data filed      Lab Results  Component Value Date   WBC 6.9 12/16/2020   HGB 15.9 12/16/2020   HCT 46.1 12/16/2020   PLT 189 12/16/2020   GLUCOSE 86 12/16/2020   CHOL 176 12/16/2020   TRIG 185 (H) 12/16/2020   HDL 54 12/16/2020   LDLCALC 91 12/16/2020   ALT 214 (HH) 12/16/2020   AST 78 (H) 12/16/2020   NA 138 12/16/2020   K 3.8 12/16/2020   CL 99 12/16/2020   CREATININE 0.89 12/16/2020   BUN 11 12/16/2020   CO2 18 (L) 12/16/2020   TSH 2.120 12/16/2020      Assessment & Plan:   1. Essential hypertension Not at goal. Increase lisinopril to 20 mg once daily.  - CBC with Differential/Platelet - Comprehensive metabolic panel - TSH  2. Mixed hyperlipidemia Well controlled.  No changes to medicines.  Continue to work on eating a healthy diet and exercise.  Labs drawn today.  - Lipid panel  3. Hypogonadism in male - Testosterone,Free and Total  4. BMI 28.0-28.9,adult Recommend continue to work on eating healthy diet and exercise.  5. Non-seasonal allergic rhinitis due to pollen Prednisone 50 mg once daily x 5 days.   6. Bilateral impacted cerumen  Debrox drops.  Meds ordered this encounter  Medications  . predniSONE (DELTASONE) 50 MG tablet    Sig: Take 1 tablet (50 mg total) by mouth daily with breakfast.    Dispense:  5 tablet    Refill:  0  . lisinopril (ZESTRIL) 20 MG tablet    Sig: Take 1 tablet (20 mg  total) by mouth daily.    Dispense:  90 tablet    Refill:  0    Orders Placed This Encounter  Procedures  . CBC with Differential/Platelet  . Comprehensive metabolic panel  . Lipid panel  . TSH  . Testosterone,Free and Total  . Cardiovascular Risk Assessment     Follow-up: Return in about 3 weeks (around 01/06/2021).  An After Visit Summary was printed and given to the patient.  Rochel Brome, MD [facility] [phone]

## 2020-12-18 LAB — COMPREHENSIVE METABOLIC PANEL
ALT: 214 IU/L (ref 0–44)
AST: 78 IU/L — ABNORMAL HIGH (ref 0–40)
Albumin/Globulin Ratio: 2.2 (ref 1.2–2.2)
Albumin: 5.1 g/dL — ABNORMAL HIGH (ref 4.0–5.0)
Alkaline Phosphatase: 103 IU/L (ref 44–121)
BUN/Creatinine Ratio: 12 (ref 9–20)
BUN: 11 mg/dL (ref 6–24)
Bilirubin Total: 0.7 mg/dL (ref 0.0–1.2)
CO2: 18 mmol/L — ABNORMAL LOW (ref 20–29)
Calcium: 9.4 mg/dL (ref 8.7–10.2)
Chloride: 99 mmol/L (ref 96–106)
Creatinine, Ser: 0.89 mg/dL (ref 0.76–1.27)
Globulin, Total: 2.3 g/dL (ref 1.5–4.5)
Glucose: 86 mg/dL (ref 65–99)
Potassium: 3.8 mmol/L (ref 3.5–5.2)
Sodium: 138 mmol/L (ref 134–144)
Total Protein: 7.4 g/dL (ref 6.0–8.5)
eGFR: 106 mL/min/{1.73_m2} (ref >59)

## 2020-12-18 LAB — CBC WITH DIFFERENTIAL/PLATELET
Basophils Absolute: 0.1 10*3/uL (ref 0.0–0.2)
Basos: 1 %
EOS (ABSOLUTE): 0.2 10*3/uL (ref 0.0–0.4)
Eos: 3 %
Hematocrit: 46.1 % (ref 37.5–51.0)
Hemoglobin: 15.9 g/dL (ref 13.0–17.7)
Immature Grans (Abs): 0.1 10*3/uL (ref 0.0–0.1)
Immature Granulocytes: 1 %
Lymphocytes Absolute: 1.5 10*3/uL (ref 0.7–3.1)
Lymphs: 22 %
MCH: 30.9 pg (ref 26.6–33.0)
MCHC: 34.5 g/dL (ref 31.5–35.7)
MCV: 90 fL (ref 79–97)
Monocytes Absolute: 0.6 10*3/uL (ref 0.1–0.9)
Monocytes: 9 %
Neutrophils Absolute: 4.5 10*3/uL (ref 1.4–7.0)
Neutrophils: 64 %
Platelets: 189 10*3/uL (ref 150–450)
RBC: 5.14 x10E6/uL (ref 4.14–5.80)
RDW: 12.4 % (ref 11.6–15.4)
WBC: 6.9 10*3/uL (ref 3.4–10.8)

## 2020-12-18 LAB — LIPID PANEL
Chol/HDL Ratio: 3.3 ratio (ref 0.0–5.0)
Cholesterol, Total: 176 mg/dL (ref 100–199)
HDL: 54 mg/dL (ref >39)
LDL Chol Calc (NIH): 91 mg/dL (ref 0–99)
Triglycerides: 185 mg/dL — ABNORMAL HIGH (ref 0–149)
VLDL Cholesterol Cal: 31 mg/dL (ref 5–40)

## 2020-12-18 LAB — TSH: TSH: 2.12 u[IU]/mL (ref 0.450–4.500)

## 2020-12-18 LAB — CARDIOVASCULAR RISK ASSESSMENT

## 2020-12-18 LAB — TESTOSTERONE,FREE AND TOTAL
Testosterone, Free: 11 pg/mL (ref 6.8–21.5)
Testosterone: 556 ng/dL (ref 264–916)

## 2020-12-19 ENCOUNTER — Other Ambulatory Visit: Payer: Self-pay

## 2020-12-19 DIAGNOSIS — R7989 Other specified abnormal findings of blood chemistry: Secondary | ICD-10-CM

## 2020-12-25 ENCOUNTER — Encounter: Payer: Self-pay | Admitting: Family Medicine

## 2021-01-13 ENCOUNTER — Ambulatory Visit: Payer: Commercial Managed Care - PPO | Admitting: Family Medicine

## 2021-01-16 ENCOUNTER — Ambulatory Visit: Payer: Commercial Managed Care - PPO

## 2021-01-16 ENCOUNTER — Other Ambulatory Visit: Payer: Self-pay

## 2021-01-16 DIAGNOSIS — R7989 Other specified abnormal findings of blood chemistry: Secondary | ICD-10-CM

## 2021-01-16 LAB — COMPREHENSIVE METABOLIC PANEL
ALT: 211 IU/L (ref 0–44)
AST: 84 IU/L — ABNORMAL HIGH (ref 0–40)
Albumin/Globulin Ratio: 2 (ref 1.2–2.2)
Albumin: 5 g/dL (ref 4.0–5.0)
Alkaline Phosphatase: 98 IU/L (ref 44–121)
BUN/Creatinine Ratio: 15 (ref 9–20)
BUN: 15 mg/dL (ref 6–24)
Bilirubin Total: 0.7 mg/dL (ref 0.0–1.2)
CO2: 25 mmol/L (ref 20–29)
Calcium: 10.1 mg/dL (ref 8.7–10.2)
Chloride: 98 mmol/L (ref 96–106)
Creatinine, Ser: 1.02 mg/dL (ref 0.76–1.27)
Globulin, Total: 2.5 g/dL (ref 1.5–4.5)
Glucose: 86 mg/dL (ref 65–99)
Potassium: 4.7 mmol/L (ref 3.5–5.2)
Sodium: 138 mmol/L (ref 134–144)
Total Protein: 7.5 g/dL (ref 6.0–8.5)
eGFR: 91 mL/min/{1.73_m2} (ref >59)

## 2021-01-18 ENCOUNTER — Other Ambulatory Visit: Payer: Self-pay

## 2021-01-18 DIAGNOSIS — R945 Abnormal results of liver function studies: Secondary | ICD-10-CM

## 2021-01-18 DIAGNOSIS — R7989 Other specified abnormal findings of blood chemistry: Secondary | ICD-10-CM

## 2021-02-22 ENCOUNTER — Other Ambulatory Visit: Payer: Self-pay | Admitting: Family Medicine

## 2021-04-05 ENCOUNTER — Other Ambulatory Visit: Payer: Self-pay | Admitting: Family Medicine

## 2021-04-05 ENCOUNTER — Other Ambulatory Visit: Payer: Self-pay | Admitting: Legal Medicine

## 2021-04-06 ENCOUNTER — Other Ambulatory Visit: Payer: Self-pay

## 2021-04-06 DIAGNOSIS — E291 Testicular hypofunction: Secondary | ICD-10-CM

## 2021-04-06 MED ORDER — TESTOSTERONE 1.62 % TD GEL
TRANSDERMAL | 2 refills | Status: DC
Start: 2021-04-06 — End: 2021-10-16

## 2021-04-18 ENCOUNTER — Other Ambulatory Visit: Payer: Self-pay

## 2021-04-18 DIAGNOSIS — I10 Essential (primary) hypertension: Secondary | ICD-10-CM

## 2021-04-18 MED ORDER — CYCLOBENZAPRINE HCL 5 MG PO TABS: ORAL_TABLET | ORAL | 0 refills | Status: DC

## 2021-04-18 MED ORDER — ZOLPIDEM TARTRATE 10 MG PO TABS: 10.0000 mg | ORAL_TABLET | Freq: Every evening | ORAL | 0 refills | Status: DC | PRN

## 2021-04-18 MED ORDER — LISINOPRIL 20 MG PO TABS: 20.0000 mg | ORAL_TABLET | Freq: Every day | ORAL | 0 refills | Status: DC

## 2021-04-18 NOTE — Telephone Encounter (Signed)
Patient called questioning as to why his prescriptions had not been sent in to the pharmacy, and patient was informed he was due to for an appointment which therefore that is why they were denied. He stated he did not know he thought he would be following up in 6 months instead of 3 months and that he was sorry and went ahead and scheduled his 3 month fasting appointment for next Monday. Patient stated he is completely out of the medication and wants to know if it possible to refill it enough for him to get in to his appointment?

## 2021-04-24 ENCOUNTER — Other Ambulatory Visit: Payer: Self-pay

## 2021-04-24 ENCOUNTER — Other Ambulatory Visit: Payer: Commercial Managed Care - PPO

## 2021-04-24 ENCOUNTER — Ambulatory Visit: Payer: Commercial Managed Care - PPO | Admitting: Family Medicine

## 2021-04-24 DIAGNOSIS — E782 Mixed hyperlipidemia: Secondary | ICD-10-CM

## 2021-04-24 DIAGNOSIS — I10 Essential (primary) hypertension: Secondary | ICD-10-CM

## 2021-04-25 LAB — CBC
Hematocrit: 47.2 % (ref 37.5–51.0)
Hemoglobin: 15.5 g/dL (ref 13.0–17.7)
MCH: 30.2 pg (ref 26.6–33.0)
MCHC: 32.8 g/dL (ref 31.5–35.7)
MCV: 92 fL (ref 79–97)
Platelets: 211 10*3/uL (ref 150–450)
RBC: 5.13 x10E6/uL (ref 4.14–5.80)
RDW: 12.4 % (ref 11.6–15.4)
WBC: 7.2 10*3/uL (ref 3.4–10.8)

## 2021-04-25 LAB — LIPID PANEL
Chol/HDL Ratio: 5.9 ratio — ABNORMAL HIGH (ref 0.0–5.0)
Cholesterol, Total: 279 mg/dL — ABNORMAL HIGH (ref 100–199)
HDL: 47 mg/dL (ref >39)
LDL Chol Calc (NIH): 157 mg/dL — ABNORMAL HIGH (ref 0–99)
Triglycerides: 398 mg/dL — ABNORMAL HIGH (ref 0–149)
VLDL Cholesterol Cal: 75 mg/dL — ABNORMAL HIGH (ref 5–40)

## 2021-04-25 LAB — COMPREHENSIVE METABOLIC PANEL
ALT: 301 IU/L (ref 0–44)
AST: 117 IU/L — ABNORMAL HIGH (ref 0–40)
Albumin/Globulin Ratio: 1.9 (ref 1.2–2.2)
Albumin: 4.7 g/dL (ref 4.0–5.0)
Alkaline Phosphatase: 98 IU/L (ref 44–121)
BUN/Creatinine Ratio: 12 (ref 9–20)
BUN: 12 mg/dL (ref 6–24)
Bilirubin Total: 0.4 mg/dL (ref 0.0–1.2)
CO2: 25 mmol/L (ref 20–29)
Calcium: 10.1 mg/dL (ref 8.7–10.2)
Chloride: 102 mmol/L (ref 96–106)
Creatinine, Ser: 1.04 mg/dL (ref 0.76–1.27)
Globulin, Total: 2.5 g/dL (ref 1.5–4.5)
Glucose: 85 mg/dL (ref 65–99)
Potassium: 4 mmol/L (ref 3.5–5.2)
Sodium: 143 mmol/L (ref 134–144)
Total Protein: 7.2 g/dL (ref 6.0–8.5)
eGFR: 89 mL/min/{1.73_m2} (ref >59)

## 2021-04-25 LAB — CARDIOVASCULAR RISK ASSESSMENT

## 2021-04-25 NOTE — Progress Notes (Signed)
Pt needs to keep his appt with me on 9/15.  Fax labs to Dr. Jennye Boroughs with note saying pt has appt 9/15 with him.

## 2021-04-27 ENCOUNTER — Encounter: Payer: Self-pay | Admitting: Family Medicine

## 2021-04-27 ENCOUNTER — Ambulatory Visit (INDEPENDENT_AMBULATORY_CARE_PROVIDER_SITE_OTHER): Payer: Commercial Managed Care - PPO | Admitting: Family Medicine

## 2021-04-27 ENCOUNTER — Other Ambulatory Visit: Payer: Self-pay

## 2021-04-27 VITALS — BP 122/84 | HR 108 | Temp 97.3°F | Resp 18 | Ht 72.0 in | Wt 231.6 lb

## 2021-04-27 DIAGNOSIS — K648 Other hemorrhoids: Secondary | ICD-10-CM | POA: Diagnosis not present

## 2021-04-27 DIAGNOSIS — F1020 Alcohol dependence, uncomplicated: Secondary | ICD-10-CM | POA: Diagnosis not present

## 2021-04-27 DIAGNOSIS — E782 Mixed hyperlipidemia: Secondary | ICD-10-CM

## 2021-04-27 DIAGNOSIS — R748 Abnormal levels of other serum enzymes: Secondary | ICD-10-CM

## 2021-04-27 DIAGNOSIS — F10982 Alcohol use, unspecified with alcohol-induced sleep disorder: Secondary | ICD-10-CM

## 2021-04-27 DIAGNOSIS — I10 Essential (primary) hypertension: Secondary | ICD-10-CM

## 2021-04-27 MED ORDER — HYDROCORTISONE ACETATE 25 MG RE SUPP: 25.0000 mg | Freq: Two times a day (BID) | RECTAL | 2 refills | Status: DC

## 2021-04-27 MED ORDER — GABAPENTIN 600 MG PO TABS: ORAL_TABLET | ORAL | 0 refills | Status: DC

## 2021-04-27 MED ORDER — CYCLOBENZAPRINE HCL 10 MG PO TABS: 10.0000 mg | ORAL_TABLET | Freq: Three times a day (TID) | ORAL | 0 refills | Status: DC | PRN

## 2021-04-27 NOTE — Patient Instructions (Addendum)
Gabapentin 600 mg one every 6 hours on day 1, Gabapentin 600 mg one every 8 hours on day 2,  Gabapentin 600 mg one every 12 hours on day 3,  Gabapentin 600 mg one daily on day  4. May take one daily for up to 5 days if continued withdrawal symptoms.   Alcohol Abuse and Dependence Information, Adult Alcohol is a widely available drug. People drink alcohol in different amounts. People who drink alcohol very often and in large amounts often have problems during and after drinking. They may develop what is called an alcohol use disorder. There are two main types of alcohol use disorders: Alcohol abuse. This is when you use alcohol too much or too often. You may use alcohol to make yourself feel happy or to reduce stress. You may have a hard time setting a limit on the amount you drink. Alcohol dependence. This is when you use alcohol consistently for a period of time, and your body changes as a result. This can make it hard to stop drinking because you may start to feel sick or feel different when you do not use alcohol. These symptoms are known as withdrawal. How can alcohol abuse and dependence affect me? Alcohol abuse and dependence can have a negative effect on your life. Drinking too much can lead to addiction. You may feel like you need alcohol to function normally. You may drink alcohol before work in the morning, during the day, or as soon as you get home from work in the evening. These actions can result in: Poor work performance. Job loss. Financial problems. Car crashes or criminal charges from driving after drinking alcohol. Problems in your relationships with friends and family. Losing the trust and respect of coworkers, friends, and family. Drinking heavily over a long period of time can permanently damage your body and brain, and can cause lifelong health issues, such as: Damage to your liver or pancreas. Heart problems, high blood pressure, or stroke. Certain cancers. Decreased  ability to fight infections. Brain or nerve damage. Depression. Early (premature) death. If you are careless or you crave alcohol, it is easy to drink more than your body can handle (overdose). Alcohol overdose is a serious situation that requires hospitalization. It may lead to permanent injuries or death. What can increase my risk? Having a family history of alcohol abuse. Having depression or other mental health conditions. Beginning to drink at an early age. Binge drinking often. Experiencing trauma, stress, and an unstable home life during childhood. Spending time with people who drink often. What actions can I take to prevent or manage alcohol abuse and dependence? Do not drink alcohol if: Your health care provider tells you not to drink. You are pregnant, may be pregnant, or are planning to become pregnant. If you drink alcohol: Limit how much you use to: 0-1 drink a day for women. 0-2 drinks a day for men. Be aware of how much alcohol is in your drink. In the [place] one drink equals one 12 oz bottle of beer (355 mL), one 5 oz glass of wine (148 mL), or one 1 oz glass of hard liquor (44 mL). Stop drinking if you have been drinking too much. This can be very hard to do if you are used to abusing alcohol. If you begin to have withdrawal symptoms, talk with your health care provider or a person that you trust. These symptoms may include anxiety, shaky hands, headache, nausea, sweating, or not being able to sleep. Choose to  drink nonalcoholic beverages in social gatherings and places where there may be alcohol. Activity Spend more time on activities that you enjoy that do not involve alcohol, like hobbies or exercise. Find healthy ways to cope with stress, such as exercise, meditation, or spending time with people you care about. General information Talk to your family, coworkers, and friends about supporting you in your efforts to stop drinking. If they drink, ask them not to drink  around you. Spend more time with people who do not drink alcohol. If you think that you have an alcohol dependency problem: Tell friends or family about your concerns. Talk with your health care provider or another health professional about where to get help. Work with a Paramedic and a Network engineer. Consider joining a support group for people who struggle with alcohol abuse and dependence. Where to find support  Your health care provider. SMART Recovery: www.smartrecovery.org Therapy and support groups Local treatment centers or chemical dependency counselors. Local AA groups in your community: SalaryStart.tn Where to find more information Centers for Disease Control and Prevention: FootballExhibition.com.br [employer] on Alcohol Abuse and Alcoholism: BasicStudents.dk Alcoholics Anonymous (AA): SalaryStart[employer]tn Contact a health care provider if: You drank more or for longer than you intended on more than one occasion. You tried to stop drinking or to cut back on how much you drink, but you were not able to. You often drink to the point of vomiting or passing out. You want to drink so badly that you cannot think about anything else. You have problems in your life due to drinking, but you continue to drink. You keep drinking even though you feel anxious, depressed, or have experienced memory loss. You have stopped doing the things you used to enjoy in order to drink. You have to drink more than you used to in order to get the effect you want. You experience anxiety, sweating, nausea, shakiness, and trouble sleeping when you try to stop drinking. Get help right away if: You have thoughts about hurting yourself or others. You have serious withdrawal symptoms, including: Confusion. Racing heart. High blood pressure. Fever. If you ever feel like you may hurt yourself or others, or have thoughts about taking your own life, get help right away. You can go to your nearest emergency  department or call: Your local emergency services (911 in the [place] A suicide crisis helpline, such as the National Suicide Prevention Lifeline at (365)711-2251. This is open 24 hours a day. Summary Alcohol abuse and dependence can have a negative effect on your life. Drinking too much or too often can lead to addiction. If you drink alcohol, limit how much you use. If you are having trouble keeping your drinking under control, find ways to change your behavior. Hobbies, calming activities, exercise, or support groups can help. If you feel you need help with changing your drinking habits, talk with your health care provider, a good friend, or a therapist, or go to an AA group. This information is not intended to replace advice given to you by your health care provider. Make sure you discuss any questions you have with your health care provider. Document Revised: 11/18/2018 Document Reviewed: 10/07/2018 Elsevier Patient Education  2022 [employer]  Insomnia Insomnia is a sleep disorder that makes it difficult to fall asleep or stay asleep. Insomnia can cause fatigue, low energy, difficulty concentrating, mood swings, and poor performance at work or school. There are three different ways to classify insomnia: Difficulty falling asleep. Difficulty staying asleep.  Waking up too early in the morning. Any type of insomnia can be long-term (chronic) or short-term (acute). Both are common. Short-term insomnia usually lasts for three months or less. Chronic insomnia occurs at least three times a week for longer than three months. What are the causes? Insomnia may be caused by another condition, situation, or substance, such as: Anxiety. Certain medicines. Gastroesophageal reflux disease (GERD) or other gastrointestinal conditions. Asthma or other breathing conditions. Restless legs syndrome, sleep apnea, or other sleep disorders. Chronic pain. Menopause. Stroke. Abuse of alcohol, tobacco, or  illegal drugs. Mental health conditions, such as depression. Caffeine. Neurological disorders, such as Alzheimer's disease. An overactive thyroid (hyperthyroidism). Sometimes, the cause of insomnia may not be known. What increases the risk? Risk factors for insomnia include: Gender. Women are affected more often than men. Age. Insomnia is more common as you get older. Stress. Lack of exercise. Irregular work schedule or working night shifts. Traveling between different time zones. Certain medical and mental health conditions. What are the signs or symptoms? If you have insomnia, the main symptom is having trouble falling asleep or having trouble staying asleep. This may lead to other symptoms, such as: Feeling fatigued or having low energy. Feeling nervous about going to sleep. Not feeling rested in the morning. Having trouble concentrating. Feeling irritable, anxious, or depressed. How is this diagnosed? This condition may be diagnosed based on: Your symptoms and medical history. Your health care provider may ask about: Your sleep habits. Any medical conditions you have. Your mental health. A physical exam. How is this treated? Treatment for insomnia depends on the cause. Treatment may focus on treating an underlying condition that is causing insomnia. Treatment may also include: Medicines to help you sleep. Counseling or therapy. Lifestyle adjustments to help you sleep better. Follow these instructions at home: Eating and drinking  Limit or avoid alcohol, caffeinated beverages, and cigarettes, especially close to bedtime. These can disrupt your sleep. Do not eat a large meal or eat spicy foods right before bedtime. This can lead to digestive discomfort that can make it hard for you to sleep. Sleep habits  Keep a sleep diary to help you and your health care provider figure out what could be causing your insomnia. Write down: When you sleep. When you wake up during the  night. How well you sleep. How rested you feel the next day. Any side effects of medicines you are taking. What you eat and drink. Make your bedroom a dark, comfortable place where it is easy to fall asleep. Put up shades or blackout curtains to block light from outside. Use a white noise machine to block noise. Keep the temperature cool. Limit screen use before bedtime. This includes: Watching TV. Using your smartphone, tablet, or computer. Stick to a routine that includes going to bed and waking up at the same times every day and night. This can help you fall asleep faster. Consider making a quiet activity, such as reading, part of your nighttime routine. Try to avoid taking naps during the day so that you sleep better at night. Get out of bed if you are still awake after 15 minutes of trying to sleep. Keep the lights down, but try reading or doing a quiet activity. When you feel sleepy, go back to bed. General instructions Take over-the-counter and prescription medicines only as told by your health care provider. Exercise regularly, as told by your health care provider. Avoid exercise starting several hours before bedtime. Use relaxation techniques to manage  stress. Ask your health care provider to suggest some techniques that may work well for you. These may include: Breathing exercises. Routines to release muscle tension. Visualizing peaceful scenes. Make sure that you drive carefully. Avoid driving if you feel very sleepy. Keep all follow-up visits as told by your health care provider. This is important. Contact a health care provider if: You are tired throughout the day. You have trouble in your daily routine due to sleepiness. You continue to have sleep problems, or your sleep problems get worse. Get help right away if: You have serious thoughts about hurting yourself or someone else. If you ever feel like you may hurt yourself or others, or have thoughts about taking your own  life, get help right away. You can go to your nearest emergency department or call: Your local emergency services (911 in the [place] A suicide crisis helpline, such as the National Suicide Prevention Lifeline at 6817965087. This is open 24 hours a day. Summary Insomnia is a sleep disorder that makes it difficult to fall asleep or stay asleep. Insomnia can be long-term (chronic) or short-term (acute). Treatment for insomnia depends on the cause. Treatment may focus on treating an underlying condition that is causing insomnia. Keep a sleep diary to help you and your health care provider figure out what could be causing your insomnia. This information is not intended to replace advice given to you by your health care provider. Make sure you discuss any questions you have with your health care provider. Document Revised: 06/09/2020 Document Reviewed: 06/09/2020 Elsevier Patient Education  2022 [employer] Inc.    Hemorrhoids Hemorrhoids are swollen veins that may develop: In the butt (rectum). These are called internal hemorrhoids. Around the opening of the butt (anus). These are called external hemorrhoids. Hemorrhoids can cause pain, itching, or bleeding. Most of the time, they do not cause serious problems. They usually get better with diet changes, lifestyle changes, and other home treatments. What are the causes? This condition may be caused by: Having trouble pooping (constipation). Pushing hard (straining) to poop. Watery poop (diarrhea). Pregnancy. Being very overweight (obese). Sitting for long periods of time. Heavy lifting or other activity that causes you to strain. Anal sex. Riding a bike for a long period of time. What are the signs or symptoms? Symptoms of this condition include: Pain. Itching or soreness in the butt. Bleeding from the butt. Leaking poop. Swelling in the area. One or more lumps around the opening of your butt. How is this diagnosed? A doctor can  often diagnose this condition by looking at the affected area. The doctor may also: Do an exam that involves feeling the area with a gloved hand (digital rectal exam). Examine the area inside your butt using a small tube (anoscope). Order blood tests. This may be done if you have lost a lot of blood. Have you get a test that involves looking inside the colon using a flexible tube with a camera on the end (sigmoidoscopy or colonoscopy). How is this treated? This condition can usually be treated at home. Your doctor may tell you to change what you eat, make lifestyle changes, or try home treatments. If these do not help, procedures can be done to remove the hemorrhoids or make them smaller. These may involve: Placing rubber bands at the base of the hemorrhoids to cut off their blood supply. Injecting medicine into the hemorrhoids to shrink them. Shining a type of light energy onto the hemorrhoids to cause them to fall off.  Doing surgery to remove the hemorrhoids or cut off their blood supply. Follow these instructions at home: Eating and drinking  Eat foods that have a lot of fiber in them. These include whole grains, beans, nuts, fruits, and vegetables. Ask your doctor about taking products that have added fiber (fibersupplements). Reduce the amount of fat in your diet. You can do this by: Eating low-fat dairy products. Eating less red meat. Avoiding processed foods. Drink enough fluid to keep your pee (urine) pale yellow. Managing pain and swelling  Take a warm-water bath (sitz bath) for 20 minutes to ease pain. Do this 3-4 times a day. You may do this in a bathtub or using a portable sitz bath that fits over the toilet. If told, put ice on the painful area. It may be helpful to use ice between your warm baths. Put ice in a plastic bag. Place a towel between your skin and the bag. Leave the ice on for 20 minutes, 2-3 times a day. General instructions Take over-the-counter and  prescription medicines only as told by your doctor. Medicated creams and medicines may be used as told. Exercise often. Ask your doctor how much and what kind of exercise is best for you. Go to the bathroom when you have the urge to poop. Do not wait. Avoid pushing too hard when you poop. Keep your butt dry and clean. Use wet toilet paper or moist towelettes after pooping. Do not sit on the toilet for a long time. Keep all follow-up visits as told by your doctor. This is important. Contact a doctor if you: Have pain and swelling that do not get better with treatment or medicine. Have trouble pooping. Cannot poop. Have pain or swelling outside the area of the hemorrhoids. Get help right away if you have: Bleeding that will not stop. Summary Hemorrhoids are swollen veins in the butt or around the opening of the butt. They can cause pain, itching, or bleeding. Eat foods that have a lot of fiber in them. These include whole grains, beans, nuts, fruits, and vegetables. Take a warm-water bath (sitz bath) for 20 minutes to ease pain. Do this 3-4 times a day. This information is not intended to replace advice given to you by your health care provider. Make sure you discuss any questions you have with your health care provider. Document Revised: 08/07/2018 Document Reviewed: 12/19/2017 Elsevier Patient Education  2022 [employer].

## 2021-04-27 NOTE — Progress Notes (Signed)
Subjective:  Patient ID: David Murphy, male    DOB: 06/28/1971  Age: 50 y.o. MRN: [id number]  Chief Complaint  Patient presents with   Hypertension    HPI Elevated Liver Function tests: drinks a lot.  Off lipitor since April. Cholesterol is elevated. Trigs 398, LDL 157, HDL 47.  Very active in job. Tries to eat healthy.   Flowsheet Row Office Visit from 04/27/2021 in [facility]  AUDIT-C Score 9       Current Outpatient Medications on File Prior to Visit  Medication Sig Dispense Refill   atorvastatin (LIPITOR) 20 MG tablet Take 1 tablet by mouth once daily (Patient not taking: Reported on 04/27/2021) 90 tablet 0   lisinopril (ZESTRIL) 20 MG tablet Take 1 tablet (20 mg total) by mouth daily. 90 tablet 0   Testosterone 1.62 % GEL APPLY 3 PUMPS TOPICALLY ONCE DAILY TO CLEAN, DRY, INTACT SKIN OF SHOULDERS AND UPPER ARMS 75 g 2   zolpidem (AMBIEN) 10 MG tablet Take 1 tablet (10 mg total) by mouth at bedtime as needed. 30 tablet 0   Current Facility-Administered Medications on File Prior to Visit  Medication Dose Route Frequency Provider Last Rate Last Admin   triamcinolone acetonide (KENALOG-40) injection 20 mg  20 mg Intramuscular Once Aldred Mase, Fritzi Mandes, MD       Past Medical History:  Diagnosis Date   Acute hepatitis B without delta-agent and without hepatic coma    Alcoholic (HCC)    Essential hypertension    Mixed hyperlipidemia    Primary insomnia    Testicular hypofunction    Past Surgical History:  Procedure Laterality Date   HERNIA REPAIR      Family History  Problem Relation Age of Onset   Hyperlipidemia Mother    Diabetes Mother    Hyperlipidemia Father    Social History   Socioeconomic History   Marital status: Divorced    Spouse name: Not on file   Number of children: 1   Years of education: Not on file   Highest education level: Not on file  Occupational History   Occupation: Delivery Driver    Comment: [employer]  Tobacco Use   Smoking  status: Never   Smokeless tobacco: Never  Substance and Sexual Activity   Alcohol use: Yes    Comment: Alcoholic, currently not in treatment   Drug use: Never   Sexual activity: Not on file  Other Topics Concern   Not on file  Social History Narrative   Not on file   Social Determinants of Health   Financial Resource Strain: Not on file  Food Insecurity: Not on file  Transportation Needs: Not on file  Physical Activity: Not on file  Stress: Not on file  Social Connections: Not on file    Review of Systems  Constitutional:  Positive for fatigue. Negative for chills and fever.  HENT:  Negative for congestion, rhinorrhea and sore throat.   Respiratory:  Negative for cough and shortness of breath.   Cardiovascular:  Negative for chest pain and palpitations.  Gastrointestinal:  Positive for blood in stool (internal hemorrhoids.). Negative for constipation, diarrhea, nausea and vomiting.  Genitourinary:  Negative for dysuria and urgency.  Musculoskeletal:  Positive for arthralgias (left shoulder pain. Kenalog shots help.). Negative for back pain and myalgias.  Neurological:  Negative for dizziness and headaches.  Psychiatric/Behavioral:  Positive for sleep disturbance (Poor sleep. difficulty going to sleep and has to get up in the am. Limits caffeine, but still has  1-2 sodas per day.). Negative for dysphoric mood. The patient is not nervous/anxious.     Objective:  BP 122/84   Pulse (!) 108   Temp (!) 97.3 F (36.3 C)   Resp 18   Ht 6' (1.829 m)   Wt 231 lb 9.6 oz (105.1 kg)   BMI 31.41 kg/m   BP/Weight 04/27/2021 12/16/2020 03/21/2020  Systolic BP 122 148 124  Diastolic BP 84 100 78  Wt. (Lbs) 231.6 229 211.4  BMI 31.41 31.06 28.67    Physical Exam Vitals reviewed.  Constitutional:      Appearance: Normal appearance.  Neck:     Vascular: No carotid bruit.  Cardiovascular:     Rate and Rhythm: Normal rate and regular rhythm.     Pulses: Normal pulses.     Heart  sounds: Normal heart sounds.  Pulmonary:     Effort: Pulmonary effort is normal.     Breath sounds: Normal breath sounds. No wheezing, rhonchi or rales.  Abdominal:     General: Bowel sounds are normal.     Palpations: Abdomen is soft.     Tenderness: There is no abdominal tenderness.  Neurological:     Mental Status: He is alert and oriented to person, place, and time.  Psychiatric:        Mood and Affect: Mood normal.        Behavior: Behavior normal.    Diabetic Foot Exam - Simple   No data filed      Lab Results  Component Value Date   WBC 7.2 04/24/2021   HGB 15.5 04/24/2021   HCT 47.2 04/24/2021   PLT 211 04/24/2021   GLUCOSE 85 04/24/2021   CHOL 279 (H) 04/24/2021   TRIG 398 (H) 04/24/2021   HDL 47 04/24/2021   LDLCALC 157 (H) 04/24/2021   ALT 301 (HH) 04/24/2021   AST 117 (H) 04/24/2021   NA 143 04/24/2021   K 4.0 04/24/2021   CL 102 04/24/2021   CREATININE 1.04 04/24/2021   BUN 12 04/24/2021   CO2 25 04/24/2021   TSH 2.120 12/16/2020      Assessment & Plan:   Problem List Items Addressed This Visit       Cardiovascular and Mediastinum   Essential hypertension    The current medical regimen is effective;  continue present plan and medications. Recommend continue to work on eating healthy diet and exercise.       Internal hemorrhoids   Relevant Medications   hydrocortisone (ANUSOL-HC) 25 MG suppository     Nervous and Auditory   Alcohol-induced insomnia (HCC)    Alcohol cessation.        Other   Mixed hyperlipidemia    Low fat diet.  Recommend exercise.      Elevated liver enzymes - Primary    Discontinue alcohol with the  Use of gabapentin.      Relevant Orders   US Abdomen Limited RUQ (LIVER/GB)   Alcoholism (HCC)    Gabapentin 600 mg one every 6 hours on day 1, Gabapentin 600 mg one every 8 hours on day 2,  Gabapentin 600 mg one every 12 hours on day 3,  Gabapentin 600 mg one daily on day  4. May take one daily for up to 5  days if continued withdrawal symptoms.      .  Meds ordered this encounter  Medications   cyclobenzaprine (FLEXERIL) 10 MG tablet    Sig: Take 1 tablet (10 mg total) by mouth  3 (three) times daily as needed for muscle spasms.    Dispense:  90 tablet    Refill:  0   gabapentin (NEURONTIN) 600 MG tablet    Sig: Take 1 tablet (600 mg total) by mouth in the morning, at noon, in the evening, and at bedtime for 1 day, THEN 1 tablet (600 mg total) 3 (three) times daily for 1 day, THEN 1 tablet (600 mg total) 2 (two) times daily for 1 day, THEN 1 tablet (600 mg total) daily for 1 day. May continue once a day if having withdrawal symptoms past day 5..    Dispense:  15 tablet    Refill:  0   hydrocortisone (ANUSOL-HC) 25 MG suppository    Sig: Place 1 suppository (25 mg total) rectally 2 (two) times daily.    Dispense:  12 suppository    Refill:  2    Orders Placed This Encounter  Procedures   US Abdomen Limited RUQ (LIVER/GB)     Follow-up: Return in about 2 weeks (around 05/11/2021) for chronic follow up.  An After Visit Summary was printed and given to the patient.  Blane Ohara, MD [facility] [phone]

## 2021-05-01 ENCOUNTER — Ambulatory Visit: Payer: Commercial Managed Care - PPO | Admitting: Family Medicine

## 2021-05-04 DIAGNOSIS — R748 Abnormal levels of other serum enzymes: Secondary | ICD-10-CM | POA: Insufficient documentation

## 2021-05-04 DIAGNOSIS — K648 Other hemorrhoids: Secondary | ICD-10-CM | POA: Insufficient documentation

## 2021-05-04 DIAGNOSIS — F1020 Alcohol dependence, uncomplicated: Secondary | ICD-10-CM | POA: Insufficient documentation

## 2021-05-04 DIAGNOSIS — F10982 Alcohol use, unspecified with alcohol-induced sleep disorder: Secondary | ICD-10-CM | POA: Insufficient documentation

## 2021-05-04 NOTE — Assessment & Plan Note (Signed)
Low fat diet.  Recommend exercise.

## 2021-05-04 NOTE — Assessment & Plan Note (Signed)
Alcohol cessation

## 2021-05-04 NOTE — Assessment & Plan Note (Signed)
The current medical regimen is effective;  continue present plan and medications. Recommend continue to work on eating healthy diet and exercise.  

## 2021-05-04 NOTE — Assessment & Plan Note (Signed)
Discontinue alcohol with the  Use of gabapentin.

## 2021-05-04 NOTE — Assessment & Plan Note (Signed)
Gabapentin 600 mg one every 6 hours on day 1, Gabapentin 600 mg one every 8 hours on day 2,  Gabapentin 600 mg one every 12 hours on day 3,  Gabapentin 600 mg one daily on day  4. May take one daily for up to 5 days if continued withdrawal symptoms.

## 2021-05-05 ENCOUNTER — Ambulatory Visit
Admission: RE | Admit: 2021-05-05 | Discharge: 2021-05-05 | Disposition: A | Discharge status: Home / Self Care | Payer: Commercial Managed Care - PPO | Source: Ambulatory Visit | Attending: Family Medicine | Admitting: Family Medicine

## 2021-05-08 ENCOUNTER — Ambulatory Visit (INDEPENDENT_AMBULATORY_CARE_PROVIDER_SITE_OTHER): Payer: Commercial Managed Care - PPO | Admitting: Family Medicine

## 2021-05-08 ENCOUNTER — Other Ambulatory Visit: Payer: Self-pay

## 2021-05-08 ENCOUNTER — Encounter: Payer: Self-pay | Admitting: Family Medicine

## 2021-05-08 VITALS — BP 144/94 | HR 100 | Temp 97.4°F | Ht 72.0 in | Wt 236.0 lb

## 2021-05-08 DIAGNOSIS — R748 Abnormal levels of other serum enzymes: Secondary | ICD-10-CM

## 2021-05-08 DIAGNOSIS — I10 Essential (primary) hypertension: Secondary | ICD-10-CM | POA: Diagnosis not present

## 2021-05-08 DIAGNOSIS — K760 Fatty (change of) liver, not elsewhere classified: Secondary | ICD-10-CM | POA: Insufficient documentation

## 2021-05-08 DIAGNOSIS — F1020 Alcohol dependence, uncomplicated: Secondary | ICD-10-CM

## 2021-05-08 DIAGNOSIS — E782 Mixed hyperlipidemia: Secondary | ICD-10-CM

## 2021-05-08 MED ORDER — EZETIMIBE 10 MG PO TABS: 10.0000 mg | ORAL_TABLET | Freq: Every day | ORAL | 0 refills | Status: DC

## 2021-05-08 MED ORDER — ICOSAPENT ETHYL 1 G PO CAPS: 2.0000 g | ORAL_CAPSULE | Freq: Two times a day (BID) | ORAL | 0 refills | Status: DC

## 2021-05-08 MED ORDER — LISINOPRIL 40 MG PO TABS: 40.0000 mg | ORAL_TABLET | Freq: Every day | ORAL | 0 refills | Status: DC

## 2021-05-08 NOTE — Assessment & Plan Note (Signed)
Fatty liver. Recheck cmp today.  Discontinue alcohol.  No tylenol.  Low fat diet.

## 2021-05-08 NOTE — Assessment & Plan Note (Addendum)
Low fat diet and weight loss. Marland Kitchen

## 2021-05-08 NOTE — Progress Notes (Signed)
Subjective:  Patient ID: David Murphy, male    DOB: 05-26-1971  Age: 50 y.o. MRN: [id number]  Chief Complaint  Patient presents with   2 Week Follow-up   Hyperlipidemia   Hypertension    HPI Elevated LFTs: US Showed fatty liver. Held lipitor. Denies taking tylenol. Alcoholism- is still taking gabapentin which helps some maybe. He reports he was drinking 4-5 beers daily and says he has cut down to 2 beers per day. Denies any withdrawal sxs. Pt does not know why he has not quit yet.   HTN: on lisinopril 20 mg daily. Pt can feel his bp going up in the evening when he gets off work. His face will feel hot and flushed.  Current Outpatient Medications on File Prior to Visit  Medication Sig Dispense Refill   cyclobenzaprine (FLEXERIL) 10 MG tablet Take 1 tablet (10 mg total) by mouth 3 (three) times daily as needed for muscle spasms. 90 tablet 0   hydrocortisone (ANUSOL-HC) 25 MG suppository Place 1 suppository (25 mg total) rectally 2 (two) times daily. 12 suppository 2   Testosterone 1.62 % GEL APPLY 3 PUMPS TOPICALLY ONCE DAILY TO CLEAN, DRY, INTACT SKIN OF SHOULDERS AND UPPER ARMS 75 g 2   zolpidem (AMBIEN) 10 MG tablet Take 1 tablet (10 mg total) by mouth at bedtime as needed. 30 tablet 0   Current Facility-Administered Medications on File Prior to Visit  Medication Dose Route Frequency Provider Last Rate Last Admin   triamcinolone acetonide (KENALOG-40) injection 20 mg  20 mg Intramuscular Once Ary Rudnick, Fritzi Mandes, MD       Past Medical History:  Diagnosis Date   Acute hepatitis B without delta-agent and without hepatic coma    Alcoholic (HCC)    Essential hypertension    Mixed hyperlipidemia    Primary insomnia    Testicular hypofunction    Past Surgical History:  Procedure Laterality Date   HERNIA REPAIR      Family History  Problem Relation Age of Onset   Hyperlipidemia Mother    Diabetes Mother    Hyperlipidemia Father    Social History   Socioeconomic History    Marital status: Divorced    Spouse name: Not on file   Number of children: 1   Years of education: Not on file   Highest education level: Not on file  Occupational History   Occupation: Delivery Driver    Comment: [employer]  Tobacco Use   Smoking status: Never   Smokeless tobacco: Never  Substance and Sexual Activity   Alcohol use: Yes    Comment: Alcoholic, currently not in treatment   Drug use: Never   Sexual activity: Not on file  Other Topics Concern   Not on file  Social History Narrative   Not on file   Social Determinants of Health   Financial Resource Strain: Not on file  Food Insecurity: Not on file  Transportation Needs: Not on file  Physical Activity: Not on file  Stress: Not on file  Social Connections: Not on file    Review of Systems  Constitutional:  Negative for chills, diaphoresis, fatigue and fever.  HENT:  Positive for ear pain (Left ear pain). Negative for congestion and sore throat.   Respiratory:  Negative for cough and shortness of breath.   Cardiovascular:  Negative for chest pain and leg swelling.  Gastrointestinal:  Negative for abdominal pain, constipation, diarrhea, nausea and vomiting.  Genitourinary:  Negative for dysuria and urgency.  Musculoskeletal:  Negative for  arthralgias and myalgias.  Neurological:  Negative for dizziness and headaches.  Psychiatric/Behavioral:  Negative for dysphoric mood.     Objective:  BP (!) 144/94   Pulse 100   Temp (!) 97.4 F (36.3 C)   Ht 6' (1.829 m)   Wt 236 lb (107 kg)   SpO2 97%   BMI 32.01 kg/m   BP/Weight 05/08/2021 04/27/2021 12/16/2020  Systolic BP 144 122 148  Diastolic BP 94 84 100  Wt. (Lbs) 236 231.6 229  BMI 32.01 31.41 31.06    Physical Exam Vitals reviewed.  Constitutional:      Appearance: Normal appearance. He is obese.  HENT:     Right Ear: Tympanic membrane normal.     Left Ear: Tympanic membrane normal.  Cardiovascular:     Rate and Rhythm: Normal rate and regular  rhythm.     Heart sounds: Normal heart sounds.  Pulmonary:     Effort: Pulmonary effort is normal.     Breath sounds: Normal breath sounds. No wheezing, rhonchi or rales.  Neurological:     Mental Status: He is alert.  Psychiatric:        Mood and Affect: Mood normal.        Behavior: Behavior normal.    Diabetic Foot Exam - Simple   No data filed      Lab Results  Component Value Date   WBC 7.2 04/24/2021   HGB 15.5 04/24/2021   HCT 47.2 04/24/2021   PLT 211 04/24/2021   GLUCOSE 85 04/24/2021   CHOL 279 (H) 04/24/2021   TRIG 398 (H) 04/24/2021   HDL 47 04/24/2021   LDLCALC 157 (H) 04/24/2021   ALT 301 (HH) 04/24/2021   AST 117 (H) 04/24/2021   NA 143 04/24/2021   K 4.0 04/24/2021   CL 102 04/24/2021   CREATININE 1.04 04/24/2021   BUN 12 04/24/2021   CO2 25 04/24/2021   TSH 2.120 12/16/2020      Assessment & Plan:   Problem List Items Addressed This Visit       Cardiovascular and Mediastinum   Essential hypertension   Relevant Medications   ezetimibe (ZETIA) 10 MG tablet   icosapent Ethyl (VASCEPA) 1 g capsule   lisinopril (ZESTRIL) 40 MG tablet     Other   Mixed hyperlipidemia   Relevant Medications   ezetimibe (ZETIA) 10 MG tablet   icosapent Ethyl (VASCEPA) 1 g capsule   lisinopril (ZESTRIL) 40 MG tablet   Elevated liver enzymes - Primary   Relevant Orders   Comprehensive metabolic panel   Alcoholism (HCC)  .  Meds ordered this encounter  Medications   ezetimibe (ZETIA) 10 MG tablet    Sig: Take 1 tablet (10 mg total) by mouth daily.    Dispense:  90 tablet    Refill:  0   icosapent Ethyl (VASCEPA) 1 g capsule    Sig: Take 2 capsules (2 g total) by mouth 2 (two) times daily.    Dispense:  360 capsule    Refill:  0   lisinopril (ZESTRIL) 40 MG tablet    Sig: Take 1 tablet (40 mg total) by mouth daily.    Dispense:  90 tablet    Refill:  0     Orders Placed This Encounter  Procedures   Comprehensive metabolic panel       Follow-up: Return in about 4 weeks (around 06/05/2021) for HTN.  An After Visit Summary was printed and given to the patient.  Fritzi Mandes  Cheyenne Bordeaux, MD [place] [phone]

## 2021-05-08 NOTE — Patient Instructions (Addendum)
Hypertension:  Increase lisinopril to 40 mg daily (may take 20 mg 2 tablets daily.) Alcoholism: Recommend AA. Stop gabapentin Fatty Liver: Low fat diet.  Hyperlipidemia (high cholesterol) Start on zetia 10 mg once daily.  Start on vascepa 1 gm 2 capsules twice a day.   Heart-Healthy Eating Plan Heart-healthy meal planning includes: Eating less unhealthy fats. Eating more healthy fats. Making other changes in your diet. Talk with your doctor or a diet specialist (dietitian) to create an eating plan that is right for you.  What are tips for following this plan? Cooking Avoid frying your food. Try to bake, boil, grill, or broil it instead. You can also reduce fat by: Removing the skin from poultry. Removing all visible fats from meats. Steaming vegetables in water or broth. Meal planning  At meals, divide your plate into four equal parts: Fill one-half of your plate with vegetables and green salads. Fill one-fourth of your plate with whole grains. Fill one-fourth of your plate with lean protein foods. Eat 4-5 servings of vegetables per day. A serving of vegetables is: 1 cup of raw or cooked vegetables. 2 cups of raw leafy greens. Eat 4-5 servings of fruit per day. A serving of fruit is: 1 medium whole fruit.  cup of dried fruit.  cup of fresh, frozen, or canned fruit.  cup of 100% fruit juice. Eat more foods that have soluble fiber. These are apples, broccoli, carrots, beans, peas, and barley. Try to get 20-30 g of fiber per day. Eat 4-5 servings of nuts, legumes, and seeds per week: 1 serving of dried beans or legumes equals  cup after being cooked. 1 serving of nuts is  cup. 1 serving of seeds equals 1 tablespoon. General information Eat more home-cooked food. Eat less restaurant, buffet, and fast food. Limit or avoid alcohol. Limit foods that are high in starch and sugar. Avoid fried foods. Lose weight if you are overweight. Keep track of how much salt (sodium) you  eat. This is important if you have high blood pressure. Ask your doctor to tell you more about this. Try to add vegetarian meals each week. Fats Choose healthy fats. These include olive oil and canola oil, flaxseeds, walnuts, almonds, and seeds. Eat more omega-3 fats. These include salmon, mackerel, sardines, tuna, flaxseed oil, and ground flaxseeds. Try to eat fish at least 2 times each week. Check food labels. Avoid foods with trans fats or high amounts of saturated fat. Limit saturated fats. These are often found in animal products, such as meats, butter, and cream. These are also found in plant foods, such as palm oil, palm kernel oil, and coconut oil. Avoid foods with partially hydrogenated oils in them. These have trans fats. Examples are stick margarine, some tub margarines, cookies, crackers, and other baked goods. What foods can I eat? Fruits All fresh, canned (in natural juice), or frozen fruits. Vegetables Fresh or frozen vegetables (raw, steamed, roasted, or grilled). Green salads. Grains Most grains. Choose whole wheat and whole grains most of the time. Rice and pasta, including brown rice and pastas made with whole wheat. Meats and other proteins Lean, well-trimmed beef, veal, pork, and lamb. Chicken and [place] without skin. All fish and shellfish. Wild duck, rabbit, pheasant, and venison. Egg whites or low-cholesterol egg substitutes. Dried beans, peas, lentils, and tofu. Seeds and most nuts. Dairy Low-fat or nonfat cheeses, including ricotta and mozzarella. Skim or 1% milk that is liquid, powdered, or evaporated. Buttermilk that is made with low-fat milk. Nonfat or  low-fat yogurt. Fats and oils Non-hydrogenated (trans-free) margarines. Vegetable oils, including soybean, sesame, sunflower, olive, peanut, safflower, corn, canola, and cottonseed. Salad dressings or mayonnaise made with a vegetable oil. Beverages Mineral water. Coffee and tea. Diet carbonated beverages. Sweets  and desserts Sherbet, gelatin, and fruit ice. Small amounts of dark chocolate. Limit all sweets and desserts. Seasonings and condiments All seasonings and condiments. The items listed above may not be a complete list of foods and drinks you can eat. Contact a dietitian for more options. What foods should I avoid? Fruits Canned fruit in heavy syrup. Fruit in cream or butter sauce. Fried fruit. Limit coconut. Vegetables Vegetables cooked in cheese, cream, or butter sauce. Fried vegetables. Grains Breads that are made with saturated or trans fats, oils, or whole milk. Croissants. Sweet rolls. Donuts. High-fat crackers, such as cheese crackers. Meats and other proteins Fatty meats, such as hot dogs, ribs, sausage, bacon, rib-eye roast or steak. High-fat deli meats, such as salami and bologna. Caviar. Domestic duck and goose. Organ meats, such as liver. Dairy Cream, sour cream, cream cheese, and creamed cottage cheese. Whole-milk cheeses. Whole or 2% milk that is liquid, evaporated, or condensed. Whole buttermilk. Cream sauce or high-fat cheese sauce. Yogurt that is made from whole milk. Fats and oils Meat fat, or shortening. Cocoa butter, hydrogenated oils, palm oil, coconut oil, palm kernel oil. Solid fats and shortenings, including bacon fat, salt pork, lard, and butter. Nondairy cream substitutes. Salad dressings with cheese or sour cream. Beverages Regular sodas and juice drinks with added sugar. Sweets and desserts Frosting. Pudding. Cookies. Cakes. Pies. Milk chocolate or white chocolate. Buttered syrups. Full-fat ice cream or ice cream drinks. The items listed above may not be a complete list of foods and drinks to avoid. Contact a dietitian for more information. Summary Heart-healthy meal planning includes eating less unhealthy fats, eating more healthy fats, and making other changes in your diet. Eat a balanced diet. This includes fruits and vegetables, low-fat or nonfat dairy, lean  protein, nuts and legumes, whole grains, and heart-healthy oils and fats. This information is not intended to replace advice given to you by your health care provider. Make sure you discuss any questions you have with your health care provider. Document Revised: 12/08/2020 Document Reviewed: 12/08/2020 Elsevier Patient Education  2022 [employer] Fatty Liver Disease The liver converts food into energy, removes toxic material from the blood, makes important proteins, and absorbs necessary vitamins from food. Fatty liver disease occurs when too much fat has built up in your liver cells. Fatty liver disease is also called hepatic steatosis. In many cases, fatty liver disease does not cause symptoms or problems. It is often diagnosed when tests are being done for other reasons. However, over time, fatty liver can cause inflammation that may lead to more serious liver problems, such as scarring of the liver (cirrhosis) and liver failure. Fatty liver is associated with insulin resistance, increased body fat, high blood pressure (hypertension), and high cholesterol. These are features of metabolic syndrome and increase your risk for stroke, diabetes, and heart disease. What are the causes? This condition may be caused by components of metabolic syndrome: Obesity. Insulin resistance. High cholesterol. Other causes: Alcohol abuse. Poor nutrition. Cushing syndrome. Pregnancy. Certain drugs. Poisons. Some viral infections. What increases the risk? You are more likely to develop this condition if you: Abuse alcohol. Are overweight. Have diabetes. Have hepatitis. Have a high triglyceride level. Are pregnant. What are the signs or symptoms? Fatty liver disease often  does not cause symptoms. If symptoms do develop, they can include: Fatigue and weakness. Weight loss. Confusion. Nausea, vomiting, or abdominal pain. Yellowing of your skin and the white parts of your eyes (jaundice). Itchy  skin. How is this diagnosed? This condition may be diagnosed by: A physical exam and your medical history. Blood tests. Imaging tests, such as an ultrasound, CT scan, or MRI. A liver biopsy. A small sample of liver tissue is removed using a needle. The sample is then looked at under a microscope. How is this treated? Fatty liver disease is often caused by other health conditions. Treatment for fatty liver may involve medicines and lifestyle changes to manage conditions such as: Alcoholism. High cholesterol. Diabetes. Being overweight or obese. Follow these instructions at home:  Do not drink alcohol. If you have trouble quitting, ask your health care provider how to safely quit with the help of medicine or a supervised program. This is important to keep your condition from getting worse. Eat a healthy diet as told by your health care provider. Ask your health care provider about working with a dietitian to develop an eating plan. Exercise regularly. This can help you lose weight and control your cholesterol and diabetes. Talk to your health care provider about an exercise plan and which activities are best for you. Take over-the-counter and prescription medicines only as told by your health care provider. Keep all follow-up visits. This is important. Contact a health care provider if: You have trouble controlling your: Blood sugar. This is especially important if you have diabetes. Cholesterol. Drinking of alcohol. Get help right away if: You have abdominal pain. You have jaundice. You have nausea and are vomiting. You vomit blood or material that looks like coffee grounds. You have stools that are black, tar-like, or bloody. Summary Fatty liver disease develops when too much fat builds up in the cells of your liver. Fatty liver disease often causes no symptoms or problems. However, over time, fatty liver can cause inflammation that may lead to more serious liver problems, such as  scarring of the liver (cirrhosis). You are more likely to develop this condition if you abuse alcohol, are pregnant, are overweight, have diabetes, have hepatitis, or have high triglyceride or cholesterol levels. Contact your health care provider if you have trouble controlling your blood sugar, cholesterol, or drinking of alcohol. This information is not intended to replace advice given to you by your health care provider. Make sure you discuss any questions you have with your health care provider. Document Revised: 05/12/2020 Document Reviewed: 05/12/2020 Elsevier Patient Education  2022 [employer].

## 2021-05-08 NOTE — Assessment & Plan Note (Signed)
Not at goal. Start on zetia 10 mg daily . Start on vascepa 1 gm 2 capsules twice daily.

## 2021-05-08 NOTE — Assessment & Plan Note (Signed)
Not at goal Increase lisinopril to 40 mg daily.

## 2021-05-09 LAB — COMPREHENSIVE METABOLIC PANEL
ALT: 317 IU/L (ref 0–44)
AST: 134 IU/L — ABNORMAL HIGH (ref 0–40)
Albumin/Globulin Ratio: 2.1 (ref 1.2–2.2)
Albumin: 4.9 g/dL (ref 4.0–5.0)
Alkaline Phosphatase: 104 IU/L (ref 44–121)
BUN/Creatinine Ratio: 15 (ref 9–20)
BUN: 16 mg/dL (ref 6–24)
Bilirubin Total: 0.3 mg/dL (ref 0.0–1.2)
CO2: 26 mmol/L (ref 20–29)
Calcium: 9.9 mg/dL (ref 8.7–10.2)
Chloride: 101 mmol/L (ref 96–106)
Creatinine, Ser: 1.08 mg/dL (ref 0.76–1.27)
Globulin, Total: 2.3 g/dL (ref 1.5–4.5)
Glucose: 109 mg/dL — ABNORMAL HIGH (ref 70–99)
Potassium: 5 mmol/L (ref 3.5–5.2)
Sodium: 145 mmol/L — ABNORMAL HIGH (ref 134–144)
Total Protein: 7.2 g/dL (ref 6.0–8.5)
eGFR: 84 mL/min/{1.73_m2} (ref >59)

## 2021-05-17 ENCOUNTER — Other Ambulatory Visit: Payer: Self-pay | Admitting: Family Medicine

## 2021-05-17 DIAGNOSIS — R748 Abnormal levels of other serum enzymes: Secondary | ICD-10-CM

## 2021-05-17 DIAGNOSIS — R7989 Other specified abnormal findings of blood chemistry: Secondary | ICD-10-CM

## 2021-05-18 ENCOUNTER — Encounter: Payer: Self-pay | Admitting: Family Medicine

## 2021-05-18 ENCOUNTER — Telehealth: Payer: Self-pay | Admitting: Oncology

## 2021-05-18 NOTE — Telephone Encounter (Signed)
Scheduled appt per 10/5 referral. Pt is aware of appt date and time. Msg was sent to referring provider to let them know when pt was scheduled.

## 2021-06-01 LAB — SPECIMEN STATUS REPORT

## 2021-06-01 LAB — FERRITIN: Ferritin: 748 ng/mL — ABNORMAL HIGH (ref 30–400)

## 2021-06-05 ENCOUNTER — Encounter: Payer: Self-pay | Admitting: Nurse Practitioner

## 2021-06-05 ENCOUNTER — Ambulatory Visit (INDEPENDENT_AMBULATORY_CARE_PROVIDER_SITE_OTHER): Payer: Commercial Managed Care - PPO | Admitting: Nurse Practitioner

## 2021-06-05 ENCOUNTER — Telehealth: Payer: Self-pay | Admitting: Oncology

## 2021-06-05 ENCOUNTER — Other Ambulatory Visit: Payer: Self-pay

## 2021-06-05 VITALS — BP 124/82 | HR 100 | Temp 97.1°F | Ht 72.0 in | Wt 231.0 lb

## 2021-06-05 DIAGNOSIS — Z79890 Hormone replacement therapy (postmenopausal): Secondary | ICD-10-CM

## 2021-06-05 DIAGNOSIS — I10 Essential (primary) hypertension: Secondary | ICD-10-CM

## 2021-06-05 DIAGNOSIS — G4709 Other insomnia: Secondary | ICD-10-CM

## 2021-06-05 DIAGNOSIS — R7989 Other specified abnormal findings of blood chemistry: Secondary | ICD-10-CM | POA: Diagnosis not present

## 2021-06-05 DIAGNOSIS — E782 Mixed hyperlipidemia: Secondary | ICD-10-CM | POA: Diagnosis not present

## 2021-06-05 DIAGNOSIS — Z1211 Encounter for screening for malignant neoplasm of colon: Secondary | ICD-10-CM | POA: Diagnosis not present

## 2021-06-05 DIAGNOSIS — E291 Testicular hypofunction: Secondary | ICD-10-CM

## 2021-06-05 MED ORDER — ZOLPIDEM TARTRATE 10 MG PO TABS: 10.0000 mg | ORAL_TABLET | Freq: Every evening | ORAL | 0 refills | Status: DC | PRN

## 2021-06-05 MED ORDER — EZETIMIBE 10 MG PO TABS: 10.0000 mg | ORAL_TABLET | Freq: Every day | ORAL | 0 refills | Status: DC

## 2021-06-05 NOTE — Progress Notes (Addendum)
Subjective:  Patient ID: David Murphy, male    DOB: 1971-01-27  Age: 50 y.o. MRN: [id number]  Chief Complaint  Patient presents with   Hyperlipidemia   Hypertension    HPI  David Murphy is a 50-year-ol Caucasian male that presents for follow-up of elevated liver enzymes. He has a hematology consult pending with Dr Hinton Rao. His PCP changed his cholesterol medication to Zetia, discontinued Lipitor.  Hypertension He was last seen for hypertension 1 month ago.  BP at that visit was 144 94. Management since that visit includes Lisinopril 40 mg daily  He reports excellent compliance with treatment. He is not having side effects.  He is following a Regular diet. He is not exercising. He has a physically job He does not smoke.  Use of agents associated with hypertension: none.   Outside blood pressures are not being checked. Symptoms: No chest pain No chest pressure  No palpitations No syncope  No dyspnea No orthopnea  No paroxysmal nocturnal dyspnea No lower extremity edema   Pertinent labs: Lab Results  Component Value Date   CHOL 279 (H) 04/24/2021   HDL 47 04/24/2021   LDLCALC 157 (H) 04/24/2021   TRIG 398 (H) 04/24/2021   CHOLHDL 5.9 (H) 04/24/2021   Lab Results  Component Value Date   NA 145 (H) 05/08/2021   K 5.0 05/08/2021   CREATININE 1.08 05/08/2021   EGFR 84 05/08/2021   GFRNONAA 82 03/15/2020   GLUCOSE 109 (H) 05/08/2021     The 10-year ASCVD risk score (Arnett DK, et al., 2019) is: 11.6%    Lipid/Cholesterol, Follow-up  Last lipid panel Other pertinent labs  Lab Results  Component Value Date   CHOL 279 (H) 04/24/2021   HDL 47 04/24/2021   LDLCALC 157 (H) 04/24/2021   TRIG 398 (H) 04/24/2021   CHOLHDL 5.9 (H) 04/24/2021   Lab Results  Component Value Date   ALT 317 (HH) 05/08/2021   AST 134 (H) 05/08/2021   PLT 211 04/24/2021   TSH 2.120 12/16/2020     He was last seen for this 3 months ago.  Management since that visit includes  Zetia  He reports excellent compliance with treatment. He is not having side effects.   Symptoms: No chest pain No chest pressure/discomfort  No dyspnea No lower extremity edema  No numbness or tingling of extremity No orthopnea  No palpitations No paroxysmal nocturnal dyspnea  No speech difficulty No syncope   Current diet: well balanced Current exercise: none, physically active job  The 10-year ASCVD risk score (Arnett DK, et al., 2019) is: 11.6%  Current Outpatient Medications on File Prior to Visit  Medication Sig Dispense Refill   cyclobenzaprine (FLEXERIL) 10 MG tablet Take 1 tablet (10 mg total) by mouth 3 (three) times daily as needed for muscle spasms. 90 tablet 0   ezetimibe (ZETIA) 10 MG tablet Take 1 tablet (10 mg total) by mouth daily. 90 tablet 0   hydrocortisone (ANUSOL-HC) 25 MG suppository Place 1 suppository (25 mg total) rectally 2 (two) times daily. 12 suppository 2   icosapent Ethyl (VASCEPA) 1 g capsule Take 2 capsules (2 g total) by mouth 2 (two) times daily. 360 capsule 0   lisinopril (ZESTRIL) 40 MG tablet Take 1 tablet (40 mg total) by mouth daily. 90 tablet 0   Testosterone 1.62 % GEL APPLY 3 PUMPS TOPICALLY ONCE DAILY TO CLEAN, DRY, INTACT SKIN OF SHOULDERS AND UPPER ARMS 75 g 2   zolpidem (AMBIEN) 10 MG tablet Take  1 tablet (10 mg total) by mouth at bedtime as needed. 30 tablet 0   Current Facility-Administered Medications on File Prior to Visit  Medication Dose Route Frequency Provider Last Rate Last Admin   triamcinolone acetonide (KENALOG-40) injection 20 mg  20 mg Intramuscular Once Cox, Elnita Maxwell, MD       Past Medical History:  Diagnosis Date   Acute hepatitis B without delta-agent and without hepatic coma    Alcoholic ([place])    Essential hypertension    Mixed hyperlipidemia    Primary insomnia    Testicular hypofunction    Past Surgical History:  Procedure Laterality Date   HERNIA REPAIR      Family History  Problem Relation Age of Onset    Hyperlipidemia Mother    Diabetes Mother    Hyperlipidemia Father    Social History   Socioeconomic History   Marital status: Divorced    Spouse name: Not on file   Number of children: 1   Years of education: Not on file   Highest education level: Not on file  Occupational History   Occupation: Delivery Driver    Comment: [employer]  Tobacco Use   Smoking status: Never   Smokeless tobacco: Never  Substance and Sexual Activity   Alcohol use: Yes    Comment: Alcoholic, currently not in treatment   Drug use: Never   Sexual activity: Not on file  Other Topics Concern   Not on file  Social History Narrative   Not on file   Social Determinants of Health   Financial Resource Strain: Not on file  Food Insecurity: Not on file  Transportation Needs: Not on file  Physical Activity: Not on file  Stress: Not on file  Social Connections: Not on file    Review of Systems  Constitutional:  Negative for appetite change, fatigue and unexpected weight change.  HENT:  Negative for congestion, ear pain, rhinorrhea, sinus pressure, sinus pain and tinnitus.   Eyes:  Negative for pain.  Respiratory:  Negative for cough and shortness of breath.   Cardiovascular:  Negative for chest pain, palpitations and leg swelling.  Gastrointestinal:  Negative for abdominal pain, constipation, diarrhea, nausea and vomiting.  Endocrine: Negative for cold intolerance, heat intolerance, polydipsia, polyphagia and polyuria.  Genitourinary:  Negative for dysuria, frequency and hematuria.  Musculoskeletal:  Negative for arthralgias, back pain, joint swelling and myalgias.  Skin:  Negative for rash.  Allergic/Immunologic: Negative for environmental allergies.  Neurological:  Negative for dizziness and headaches.  Hematological:  Negative for adenopathy.  Psychiatric/Behavioral:  Negative for decreased concentration and sleep disturbance. The patient is not nervous/anxious.     Objective:  Pulse 100   Temp  (!) 97.1 F (36.2 C)   Ht 6' (1.829 m)   Wt 231 lb (104.8 kg)   SpO2 98%   BMI 31.33 kg/m  BP 124/82   Pulse 100   Temp (!) 97.1 F (36.2 C)   Ht 6' (1.829 m)   Wt 231 lb (104.8 kg)   SpO2 98%   BMI 31.33 kg/m    BP/Weight 06/05/2021 05/08/2021 3/53/2992  Systolic BP - 426 834  Diastolic BP - 94 84  Wt. (Lbs) 231 236 231.6  BMI 31.33 32.01 31.41    Physical Exam Vitals reviewed.  Constitutional:      Appearance: Normal appearance.  HENT:     Head: Normocephalic.     Right Ear: Tympanic membrane normal.     Left Ear: Tympanic membrane normal.  Nose: Nose normal.     Mouth/Throat:     Mouth: Mucous membranes are moist.  Eyes:     Pupils: Pupils are equal, round, and reactive to light.  Cardiovascular:     Rate and Rhythm: Normal rate and regular rhythm.     Pulses: Normal pulses.     Heart sounds: Normal heart sounds.  Pulmonary:     Effort: Pulmonary effort is normal.     Breath sounds: Normal breath sounds.  Abdominal:     General: Bowel sounds are normal.     Palpations: Abdomen is soft.  Musculoskeletal:        General: Normal range of motion.     Cervical back: Neck supple.  Skin:    General: Skin is warm and dry.     Capillary Refill: Capillary refill takes less than 2 seconds.  Neurological:     General: No focal deficit present.     Mental Status: He is alert and oriented to person, place, and time.  Psychiatric:        Mood and Affect: Mood normal.        Behavior: Behavior normal.      Lab Results  Component Value Date   WBC 7.2 04/24/2021   HGB 15.5 04/24/2021   HCT 47.2 04/24/2021   PLT 211 04/24/2021   GLUCOSE 109 (H) 05/08/2021   CHOL 279 (H) 04/24/2021   TRIG 398 (H) 04/24/2021   HDL 47 04/24/2021   LDLCALC 157 (H) 04/24/2021   ALT 317 (HH) 05/08/2021   AST 134 (H) 05/08/2021   NA 145 (H) 05/08/2021   K 5.0 05/08/2021   CL 101 05/08/2021   CREATININE 1.08 05/08/2021   BUN 16 05/08/2021   CO2 26 05/08/2021   TSH 2.120  12/16/2020      Assessment & Plan:    1. Elevated liver function tests - CBC with Differential/Platelet - Comprehensive metabolic panel  2. Mixed hyperlipidemia-not at goal - ezetimibe (ZETIA) 10 MG tablet; Take 1 tablet (10 mg total) by mouth daily.  Dispense: 90 tablet; Refill: 0 - CBC with Differential/Platelet - Comprehensive metabolic panel  3. Screening for colon cancer - Ambulatory referral to Gastroenterology  4. Essential hypertension-well controlled - CBC with Differential/Platelet - Comprehensive metabolic panel  5. Hypogonadism in male - CBC with Differential/Platelet - Comprehensive metabolic panel  6. Other insomnia - zolpidem (AMBIEN) 10 MG tablet; Take 1 tablet (10 mg total) by mouth at bedtime as needed.  Dispense: 30 tablet; Refill: 0 - CBC with Differential/Platelet - Comprehensive metabolic panel  7. Long-term current use of testosterone replacement therapy - CBC with Differential/Platelet - Comprehensive metabolic panel    Continue prescribed medications Follow-up with hematology as scheduled We will call you with lab results and appointment for colonoscopy referral Follow-up in 87-month, fasting with Dr CTobie Poet      Follow-up: 343-month fasting  An After Visit Summary was printed and given to the patient.  I, ShRip HarbourNP, have reviewed all documentation for this visit. The documentation on 06/05/21 for the exam, diagnosis, procedures, and orders are all accurate and complete.   ShRip HarbourNP [phone]

## 2021-06-05 NOTE — Telephone Encounter (Signed)
Patient rescheduled from 10/25 to 11/09 at 3:00 pm

## 2021-06-05 NOTE — Patient Instructions (Addendum)
Continue prescribed medications Follow-up with hematology as scheduled We will call you with lab results and appointment for colonoscopy referral Follow-up in 42-months, fasting with Dr Sedalia Muta   Liver Function Tests Why am I having this test? Liver function tests are done to see how well your liver is working. The proteins and enzymes measured in the tests can alert your health care provider to inflammation, damage, or disease in your liver. It is common to have liver function tests: When you are taking certain medicines. If you have liver disease. If you drink a lot of alcohol. During annual physical exams. When you have other conditions that may affect your liver. If you have symptoms such as yellowing of the skin (jaundice), abdominal pain, or nausea and vomiting. What is being tested? These tests measure various substances in your blood. This may include: Alanine aminotransferase (ALT). This is an enzyme in the liver. Aspartate aminotransferase (AST). This is an enzyme in the liver, heart, and muscles. Alkaline phosphatase (ALP). This is a protein in the liver, bile ducts, bone, and other body tissues. Total bilirubin. This is a yellow pigment in bile. Albumin. This is a protein in the liver. Prothrombin time and international normalized ratio (PT and INR). PT measures the time it takes for your blood to clot. INR is a calculation of blood clotting time based on your PT result. Total protein. This includes two proteins, albumin and globulin, found in the blood. What kind of sample is taken? A blood sample is required for this test. It is usually collected by inserting a needle into a blood vessel. How do I prepare for this test? How you prepare will depend on which tests are being done and the reason for doing them. You may need to: Avoid eating for 4-6 hours before the test, or as told by your health care provider. Follow instructions from your health care provider about eating or  drinking restrictions before the tests. Stop taking certain medicines before your blood test, as told by your health care provider. Tell a health care provider about: All medicines you are taking, including vitamins, herbs, eye drops, creams, and over-the-counter medicines. Any medical conditions you have. Whether you are pregnant or may be pregnant. How are the results reported? Your test results will be reported as values. Your health care provider will compare your results to normal ranges that were established after testing a large group of people (reference ranges). Reference ranges may vary among labs and hospitals. For the substances measured in liver function tests, common reference ranges are: ALT Infant: 10-40 international units/L. Child or adult: 4-36 international units/L at 37C or 4-36 units/L (SI units). Reference ranges may be higher for older adults. AST Newborn 6-25 days old: 35-140 units/L. Child younger than 43 years old: 15-60 units/L. 70-59 years old: 15-50 units/L. 18-33 years old: 10-50 units/L. 20-25 years old: 10-40 units/L. Adult: 0-35 units/L or 0-0.58 microkatals/L (SI units). Reference ranges may be higher for older adults. ALP Child younger than 40 years old: 85-235 units/L. 34-15 years old: 65-210 units/L. 88-31 years old: 60-300 units/L. 37-59 years old: 30-200 units/L. Adult: 30-120 units/L or 0.5-2.0 microkatals/L (SI units). Reference ranges may be higher for older adults. Total bilirubin Newborn: 1.0-12.0 mg/dL or 63.0-160 micromoles/L (SI units). Child or adult: 0.3-1.0 mg/dL or 1.0-93 micromoles/L. Albumin Premature infant: 3.0-4.2 g/dL. Newborn: 3.5-5.4 g/dL. Infant: 4.4-5.4 g/dL. Child: 4.0-5.9 g/dL. Adult: 3.5-5.0 g/dL or 23-55 g/L (SI units). PT 11.0-12.5 seconds; 85%-100%. INR 0.8-1.1. Total protein Premature infant:  4.2-7.6 g/dL. Newborn: 4.6-7.4 g/dL. Infant: 6.0-6.7 g/dL. Child: 6.2-8.0 g/dL. Adult: 6.4-8.3 g/dL or 67-67 g/L (SI  units). What do the results mean? Results that are within the reference ranges are considered normal. For each substance measured, results outside the reference range can indicate various health issues. ALT Levels above the normal range may indicate liver disease. Sometimes levels also increase after burns, surgery, heart attack, muscle damage, or seizure. AST Levels above the normal range may indicate liver disease, skeletal muscle diseases, or other diseases that destroy the red blood cells or tissues of the pancreas. Levels below the normal range may indicate acute kidney disease, pregnancy, or diabetic ketoacidosis. ALP Levels above the normal range may be seen in biliary obstruction, liver diseases, bone disease, thyroid disease, lymphoma, or several other conditions. People with blood type O or B may show higher levels after a fatty meal. Levels below the normal range may indicate bone and teeth conditions, malnutrition, protein deficiency, or Wilson's disease. Total bilirubin Levels above the normal range may indicate problems with the liver, gallbladder, or bile ducts. Albumin Levels above the normal range may indicate dehydration. They may also be caused by a diet that is high in protein. Levels below the normal range may indicate kidney disease, liver disease, or malabsorption of nutrients. PT and INR Levels above the normal range mean that your blood is clotting slower than normal. This may be due to blood disorders, liver disorders, certain medicines like warfarin, or low levels of vitamin K. Total protein Levels above the normal range may be due to infection or other diseases. Levels below the normal range may be due to an immune system disorder, bleeding, burns, kidney disorder, liver disease, trouble absorbing or getting nutrients, or other conditions that affect the intestines. Talk with your health care provider about what your results mean. Questions to ask your health care  provider Ask your health care provider, or the department that is doing the test: When will my results be ready? How will I get my results? What are my treatment options? What other tests do I need? What are my next steps? Summary Liver function tests are done to see how well your liver is working. The results can alert your health care provider to inflammation, damage, or disease in your liver. You may need to avoid eating for 4-6 hours before the test or stop taking certain medicines before your blood test, as told by your health care provider. Talk with your health care provider about what your results mean. This information is not intended to replace advice given to you by your health care provider. Make sure you discuss any questions you have with your health care provider. Document Revised: 05/03/2020 Document Reviewed: 05/03/2020 Elsevier Patient Education  2022 [employer].

## 2021-06-06 ENCOUNTER — Encounter: Payer: Commercial Managed Care - PPO | Admitting: Oncology

## 2021-06-06 LAB — COMPREHENSIVE METABOLIC PANEL
ALT: 211 IU/L (ref 0–44)
AST: 81 IU/L — ABNORMAL HIGH (ref 0–40)
Albumin/Globulin Ratio: 1.9 (ref 1.2–2.2)
Albumin: 4.8 g/dL (ref 4.0–5.0)
Alkaline Phosphatase: 102 IU/L (ref 44–121)
BUN/Creatinine Ratio: 14 (ref 9–20)
BUN: 13 mg/dL (ref 6–24)
Bilirubin Total: 0.2 mg/dL (ref 0.0–1.2)
CO2: 24 mmol/L (ref 20–29)
Calcium: 10 mg/dL (ref 8.7–10.2)
Chloride: 101 mmol/L (ref 96–106)
Creatinine, Ser: 0.96 mg/dL (ref 0.76–1.27)
Globulin, Total: 2.5 g/dL (ref 1.5–4.5)
Glucose: 101 mg/dL — ABNORMAL HIGH (ref 70–99)
Potassium: 4.3 mmol/L (ref 3.5–5.2)
Sodium: 140 mmol/L (ref 134–144)
Total Protein: 7.3 g/dL (ref 6.0–8.5)
eGFR: 97 mL/min/{1.73_m2} (ref >59)

## 2021-06-06 LAB — CBC WITH DIFFERENTIAL/PLATELET
Basophils Absolute: 0.1 10*3/uL (ref 0.0–0.2)
Basos: 1 %
EOS (ABSOLUTE): 0.2 10*3/uL (ref 0.0–0.4)
Eos: 3 %
Hematocrit: 47 % (ref 37.5–51.0)
Hemoglobin: 16 g/dL (ref 13.0–17.7)
Immature Grans (Abs): 0.1 10*3/uL (ref 0.0–0.1)
Immature Granulocytes: 1 %
Lymphocytes Absolute: 1.8 10*3/uL (ref 0.7–3.1)
Lymphs: 21 %
MCH: 31.1 pg (ref 26.6–33.0)
MCHC: 34 g/dL (ref 31.5–35.7)
MCV: 91 fL (ref 79–97)
Monocytes Absolute: 0.6 10*3/uL (ref 0.1–0.9)
Monocytes: 7 %
Neutrophils Absolute: 5.8 10*3/uL (ref 1.4–7.0)
Neutrophils: 67 %
Platelets: 250 10*3/uL (ref 150–450)
RBC: 5.14 x10E6/uL (ref 4.14–5.80)
RDW: 11.8 % (ref 11.6–15.4)
WBC: 8.6 10*3/uL (ref 3.4–10.8)

## 2021-06-07 ENCOUNTER — Encounter: Payer: Commercial Managed Care - PPO | Admitting: Oncology

## 2021-06-20 NOTE — Progress Notes (Incomplete)
[facility]  [address] [place],  [place]  [address] [phone]  Clinic Day:  06/20/2021  Referring physician: Rochel Brome, MD  This document serves as a record of services personally performed by Hosie Poisson, MD. It was created on their behalf by [facility], a trained medical scribe. The creation of this record is based on the scribe's personal observations and the provider's statements to them.  ASSESSMENT & PLAN:   Elevated ferritin measuring 748.  Elevated liver transaminases, improving.  This is a pleasant 50 year old male with elevated liver transaminases as well as an elevated ferritin.  Thank you for the opportunity to participate in the care of your patients  I provided *** minutes of face-to-face time during this this encounter and > 50% was spent counseling as documented under my assessment and plan.    Derwood Kaplan, MD [facility] [address] [place] [address] Dept: [phone] Dept Fax: [phone]    CHIEF COMPLAINT:  CC: Elevated liver transaminases and elevated ferritin  Current Treatment:  ***   HISTORY OF PRESENT ILLNESS:  David Murphy is a 50 y.o. male referred by Dr. Rochel Brome for the evaluation and treatment of possible hemachromatosis. He has elevated liver transaminases dating back to April 2021, but these began to worsen in May 2022. In September, the SGOT was up to 134 with an SGPT of 317. Ferritin at that time was elevated at 748. Most recent lab work up from late October revealed an SGOT of 81, and an SGPT of 211. Blood counts have been unremarkable.  INTERVAL HISTORY:  I have reviewed his chart and materials related to his cancer extensively and collaborated history with the patient. Summary of oncologic history is as follows: Oncology History   No history exists.    David Murphy ***.   His  appetite is good, and  he is eating well. He denies any significant weight loss or gain.  He denies fever, chills or other signs of infection.  He denies nausea, vomiting, bowel issues, or abdominal pain.  He denies sore throat, cough, dyspnea, or chest pain.  HISTORY:   Past Medical History:  Diagnosis Date   Acute hepatitis B without delta-agent and without hepatic coma    Alcoholic ([place])    Essential hypertension    Mixed hyperlipidemia    Primary insomnia    Testicular hypofunction     Past Surgical History:  Procedure Laterality Date   HERNIA REPAIR      Family History  Problem Relation Age of Onset   Hyperlipidemia Mother    Diabetes Mother    Hyperlipidemia Father     Social History:  reports that he has never smoked. He has never used smokeless tobacco. He reports current alcohol use. He reports that he does not use drugs.The patient is {Blank single:19197::"alone","accompanied by"} *** today. He is married/divorced and lives at home ***. He has *** children. He works as a ***, and has never been exposed to chemicals or other toxic agents.  Allergies: No Known Allergies  Current Medications: Current Outpatient Medications  Medication Sig Dispense Refill   cyclobenzaprine (FLEXERIL) 10 MG tablet Take 1 tablet (10 mg total) by mouth 3 (three) times daily as needed for muscle spasms. 90 tablet 0   ezetimibe (ZETIA) 10 MG tablet Take 1 tablet (10 mg total) by mouth daily. 90 tablet 0   hydrocortisone (ANUSOL-HC) 25 MG suppository Place  1 suppository (25 mg total) rectally 2 (two) times daily. 12 suppository 2   icosapent Ethyl (VASCEPA) 1 g capsule Take 2 capsules (2 g total) by mouth 2 (two) times daily. 360 capsule 0   lisinopril (ZESTRIL) 40 MG tablet Take 1 tablet (40 mg total) by mouth daily. 90 tablet 0   Testosterone 1.62 % GEL APPLY 3 PUMPS TOPICALLY ONCE DAILY TO CLEAN, DRY, INTACT SKIN OF SHOULDERS AND UPPER ARMS 75 g 2   zolpidem (AMBIEN) 10 MG tablet Take 1 tablet (10 mg total) by  mouth at bedtime as needed. 30 tablet 0   Current Facility-Administered Medications  Medication Dose Route Frequency Provider Last Rate Last Admin   triamcinolone acetonide (KENALOG-40) injection 20 mg  20 mg Intramuscular Once Cox, Fritzi Mandes, MD        REVIEW OF SYSTEMS:  Review of Systems - Oncology    VITALS:  There were no vitals taken for this visit.  Wt Readings from Last 3 Encounters:  06/05/21 231 lb (104.8 kg)  05/08/21 236 lb (107 kg)  04/27/21 231 lb 9.6 oz (105.1 kg)    There is no height or weight on file to calculate BMI.  Performance status (ECOG): {CHL ONC [id number]  PHYSICAL EXAM:  Physical Exam   LABS:   CBC Latest Ref Rng & Units 06/05/2021 04/24/2021 12/16/2020  WBC 3.4 - 10.8 x10E3/uL 8.6 7.2 6.9  Hemoglobin 13.0 - 17.7 g/dL 97.3 53.2 99.2  Hematocrit 37.5 - 51.0 % 47.0 47.2 46.1  Platelets 150 - 450 x10E3/uL 250 211 189   CMP Latest Ref Rng & Units 06/05/2021 05/08/2021 04/24/2021  Glucose 70 - 99 mg/dL 426(S) 341(D) 85  BUN 6 - 24 mg/dL 13 16 12   Creatinine 0.76 - 1.27 mg/dL 6.22 2.97  Sodium 134 - 144 mmol/L 140 145(H) 143  Potassium 3.5 - 5.2 mmol/L 4.3 5.0 4.0  Chloride 96 - 106 mmol/L 101 101 102  CO2 20 - 29 mmol/L 24 26 25   Calcium 8.7 - 10.2 mg/dL 9.89 9.9  Total Protein 6.0 - 8.5 g/dL 7.3 7.2 7.2  Total Bilirubin 0.0 - 1.2 mg/dL 0.2 0.3 0.4  Alkaline Phos 44 - 121 IU/L 102 104 98  AST 0 - 40 IU/L 81(H) 134(H) 117(H)  ALT 0 - 44 IU/L 211(HH) 317(HH) 301(HH)     No results found for: CEA1 / No results found for: CEA1 No results found for: PSA1 No results found for: 21.1 No results found for: CAN125  No results found for: TOTALPROTELP, ALBUMINELP, A1GS, A2GS, BETS, BETA2SER, GAMS, MSPIKE, SPEI Lab Results  Component Value Date   FERRITIN 748 (H) 05/08/2021   No results found for: LDH  STUDIES:  No results found.    I, [email or web address], am acting as scribe for 05/10/2021, MD  I have reviewed this report as  typed by the medical scribe, and it is complete and accurate.

## 2021-06-21 ENCOUNTER — Inpatient Hospital Stay: Payer: Commercial Managed Care - PPO

## 2021-06-21 ENCOUNTER — Encounter: Payer: Commercial Managed Care - PPO | Admitting: Oncology

## 2021-06-21 ENCOUNTER — Other Ambulatory Visit: Payer: Self-pay | Admitting: Oncology

## 2021-06-21 DIAGNOSIS — R7989 Other specified abnormal findings of blood chemistry: Secondary | ICD-10-CM

## 2021-06-23 ENCOUNTER — Telehealth: Payer: Self-pay | Admitting: Oncology

## 2021-06-23 NOTE — Telephone Encounter (Signed)
Patient rescheduled to 11/30 Labs 3:30 pm - Consult 4:00 pm

## 2021-07-03 ENCOUNTER — Other Ambulatory Visit: Payer: Commercial Managed Care - PPO

## 2021-07-03 ENCOUNTER — Inpatient Hospital Stay: Payer: Commercial Managed Care - PPO | Attending: Oncology

## 2021-07-03 DIAGNOSIS — R790 Abnormal level of blood mineral: Secondary | ICD-10-CM | POA: Insufficient documentation

## 2021-07-03 DIAGNOSIS — R7989 Other specified abnormal findings of blood chemistry: Secondary | ICD-10-CM

## 2021-07-03 LAB — FERRITIN: Ferritin: 481 ng/mL — ABNORMAL HIGH (ref 24–336)

## 2021-07-10 ENCOUNTER — Other Ambulatory Visit: Payer: Self-pay | Admitting: Family Medicine

## 2021-07-10 ENCOUNTER — Other Ambulatory Visit: Payer: Self-pay | Admitting: Legal Medicine

## 2021-07-10 ENCOUNTER — Other Ambulatory Visit: Payer: Self-pay | Admitting: Nurse Practitioner

## 2021-07-10 DIAGNOSIS — G4709 Other insomnia: Secondary | ICD-10-CM

## 2021-07-11 NOTE — Progress Notes (Incomplete)
[facility]  [address] [place],  [place]  [address] [phone]  Clinic Day:  07/11/2021  Referring physician: Blane Ohara, MD  This document serves as a record of services personally performed by Gery Pray, MD. It was created on their behalf by [facility] E, a trained medical scribe. The creation of this record is based on the scribe's personal observations and the provider's statements to them.  ASSESSMENT & PLAN:   Possible hemochromatosis. His ferritin was up to 748 in September but most recently came down to 481. Elevated liver transaminases for the past several months. The SGOT was up to 134 and SGPT up to 317 in September, but has slowly started to improve.  This is a pleasant 50 year old male who has had elevated liver transaminases as well as ferritin.   Thank you for the opportunity to participate in the care of your patients.  I provided *** minutes of face-to-face time during this this encounter and > 50% was spent counseling as documented under my assessment and plan.    [name], MD [facility] [address] [place] [address] Dept: [phone] Dept Fax: [phone]    CHIEF COMPLAINT:  CC: Possible hemachromatosis  Current Treatment:  ***   HISTORY OF PRESENT ILLNESS:  David Murphy is a 50 y.o. male referred by Dr. Blane Ohara for the evaluation and treatment of possible hemochromatosis. He has elevated liver transaminases dating back to April 2021, but these began to worsen in May 2022. In September, the SGOT was up to 134 with an SGPT of 317. Ferritin at that time was elevated at 748. Most recent lab work up from late October revealed an SGOT of 81, and an SGPT of 211. Blood counts have been unremarkable. Most recent ferritin from November 21st was down to 481.  INTERVAL HISTORY:  I have reviewed his chart and materials  related to his cancer extensively and collaborated history with the patient. Summary of oncologic history is as follows: Oncology History   No history exists.    David Murphy ***.   His  appetite is good, and he is eating well. He denies any significant unintentional weight loss or gain.  He denies fever, chills or other signs of infection.  He denies nausea, vomiting, bowel issues, or abdominal pain.  He denies sore throat, cough, dyspnea, or chest pain.  HISTORY:   Past Medical History:  Diagnosis Date   Acute hepatitis B without delta-agent and without hepatic coma    Alcoholic (HCC)    Essential hypertension    Mixed hyperlipidemia    Primary insomnia    Testicular hypofunction     Past Surgical History:  Procedure Laterality Date   HERNIA REPAIR      Family History  Problem Relation Age of Onset   Hyperlipidemia Mother    Diabetes Mother    Hyperlipidemia Father     Social History:  reports that he has never smoked. He has never used smokeless tobacco. He reports current alcohol use. He reports that he does not use drugs.The patient is {Blank single:19197::"alone","accompanied by"} *** today. He is married/divorced and lives at home ***. He has *** children. He works as a Loss adjuster, chartered and has never been exposed to chemicals or other toxic agents.  Allergies: No Known Allergies  Current Medications: Current Outpatient Medications  Medication Sig Dispense Refill   cyclobenzaprine (FLEXERIL) 10 MG tablet Take 1 tablet (10  mg total) by mouth 3 (three) times daily as needed for muscle spasms. 90 tablet 0   ezetimibe (ZETIA) 10 MG tablet Take 1 tablet (10 mg total) by mouth daily. 90 tablet 0   hydrocortisone (ANUSOL-HC) 25 MG suppository Place 1 suppository (25 mg total) rectally 2 (two) times daily. 12 suppository 2   icosapent Ethyl (VASCEPA) 1 g capsule Take 2 capsules (2 g total) by mouth 2 (two) times daily. 360 capsule 0   lisinopril (ZESTRIL) 40 MG tablet Take 1 tablet (40 mg  total) by mouth daily. 90 tablet 0   Testosterone 1.62 % GEL APPLY 3 PUMPS TOPICALLY ONCE DAILY TO CLEAN, DRY, INTACT SKIN OF SHOULDERS AND UPPER ARMS 75 g 2   zolpidem (AMBIEN) 10 MG tablet TAKE 1 TABLET BY MOUTH AT BEDTIME AS NEEDED 30 tablet 0   Current Facility-Administered Medications  Medication Dose Route Frequency Provider Last Rate Last Admin   triamcinolone acetonide (KENALOG-40) injection 20 mg  20 mg Intramuscular Once Cox, Elnita Maxwell, MD        REVIEW OF SYSTEMS:  Review of Systems - Oncology    VITALS:  There were no vitals taken for this visit.  Wt Readings from Last 3 Encounters:  06/05/21 231 lb (104.8 kg)  05/08/21 236 lb (107 kg)  04/27/21 231 lb 9.6 oz (105.1 kg)    There is no height or weight on file to calculate BMI.  Performance status (ECOG): {CHL ONC [id number]  PHYSICAL EXAM:  Physical Exam  LABS:   CBC Latest Ref Rng & Units 06/05/2021 04/24/2021 12/16/2020  WBC 3.4 - 10.8 x10E3/uL 8.6 7.2 6.9  Hemoglobin 13.0 - 17.7 g/dL 16.0 15.5 15.9  Hematocrit 37.5 - 51.0 % 47.0 47.2 46.1  Platelets 150 - 450 x10E3/uL 250 211 189   CMP Latest Ref Rng & Units 06/05/2021 05/08/2021 04/24/2021  Glucose 70 - 99 mg/dL 101(H) 109(H) 85  BUN 6 - 24 mg/dL 13 16 12   Creatinine 0.76 - 1.27 mg/dL 0.96 1.08 1.04  Sodium 134 - 144 mmol/L 140 145(H) 143  Potassium 3.5 - 5.2 mmol/L 4.3 5.0 4.0  Chloride 96 - 106 mmol/L 101 101 102  CO2 20 - 29 mmol/L 24 26 25   Calcium 8.7 - 10.2 mg/dL 10.0 9.9 10.1  Total Protein 6.0 - 8.5 g/dL 7.3 7.2 7.2  Total Bilirubin 0.0 - 1.2 mg/dL 0.2 0.3 0.4  Alkaline Phos 44 - 121 IU/L 102 104 98  AST 0 - 40 IU/L 81(H) 134(H) 117(H)  ALT 0 - 44 IU/L 211(HH) 317(HH) 301(HH)     No results found for: CEA1 / No results found for: CEA1 No results found for: PSA1 No results found for: [id number] No results found for: [id number]  No results found for: TOTALPROTELP, ALBUMINELP, A1GS, A2GS, BETS, BETA2SER, GAMS, MSPIKE, SPEI Lab Results  Component  Value Date   FERRITIN 481 (H) 07/03/2021   FERRITIN 748 (H) 05/08/2021   No results found for: LDH  STUDIES:  No results found.    I, Rita Ohara, am acting as scribe for Derwood Kaplan, MD  I have reviewed this report as typed by the medical scribe, and it is complete and accurate.

## 2021-07-12 ENCOUNTER — Other Ambulatory Visit: Payer: Commercial Managed Care - PPO

## 2021-07-12 ENCOUNTER — Ambulatory Visit: Payer: Commercial Managed Care - PPO

## 2021-07-12 ENCOUNTER — Inpatient Hospital Stay: Payer: Commercial Managed Care - PPO | Admitting: Oncology

## 2021-07-24 ENCOUNTER — Other Ambulatory Visit: Payer: Self-pay | Admitting: Legal Medicine

## 2021-07-24 ENCOUNTER — Other Ambulatory Visit: Payer: Self-pay | Admitting: Family Medicine

## 2021-07-25 ENCOUNTER — Other Ambulatory Visit: Payer: Self-pay | Admitting: Family Medicine

## 2021-09-19 ENCOUNTER — Other Ambulatory Visit: Payer: Self-pay | Admitting: Nurse Practitioner

## 2021-09-19 ENCOUNTER — Other Ambulatory Visit: Payer: Self-pay | Admitting: Family Medicine

## 2021-09-19 DIAGNOSIS — E291 Testicular hypofunction: Secondary | ICD-10-CM

## 2021-09-19 DIAGNOSIS — I10 Essential (primary) hypertension: Secondary | ICD-10-CM

## 2021-09-19 DIAGNOSIS — G4709 Other insomnia: Secondary | ICD-10-CM

## 2021-10-04 ENCOUNTER — Other Ambulatory Visit: Payer: Self-pay | Admitting: Family Medicine

## 2021-10-04 DIAGNOSIS — I10 Essential (primary) hypertension: Secondary | ICD-10-CM

## 2021-10-04 DIAGNOSIS — E291 Testicular hypofunction: Secondary | ICD-10-CM

## 2021-10-05 ENCOUNTER — Telehealth: Payer: Self-pay

## 2021-10-05 NOTE — Telephone Encounter (Signed)
I left a message requesting the patient to call the office back regarding an appointment. Patient is overdue for an appointment. Per Dr. Sedalia Muta, "patient to schedule a fasting appointment. I will send refill when appointment made."

## 2021-10-16 ENCOUNTER — Encounter: Payer: Self-pay | Admitting: Family Medicine

## 2021-10-16 ENCOUNTER — Other Ambulatory Visit: Payer: Self-pay

## 2021-10-16 ENCOUNTER — Ambulatory Visit (INDEPENDENT_AMBULATORY_CARE_PROVIDER_SITE_OTHER): Payer: Commercial Managed Care - PPO | Admitting: Family Medicine

## 2021-10-16 VITALS — BP 118/98 | HR 86 | Temp 98.2°F | Ht 72.0 in | Wt 225.0 lb

## 2021-10-16 DIAGNOSIS — Z1211 Encounter for screening for malignant neoplasm of colon: Secondary | ICD-10-CM | POA: Insufficient documentation

## 2021-10-16 DIAGNOSIS — I10 Essential (primary) hypertension: Secondary | ICD-10-CM

## 2021-10-16 DIAGNOSIS — R748 Abnormal levels of other serum enzymes: Secondary | ICD-10-CM

## 2021-10-16 DIAGNOSIS — E291 Testicular hypofunction: Secondary | ICD-10-CM

## 2021-10-16 DIAGNOSIS — D751 Secondary polycythemia: Secondary | ICD-10-CM | POA: Insufficient documentation

## 2021-10-16 DIAGNOSIS — Z6830 Body mass index (BMI) 30.0-30.9, adult: Secondary | ICD-10-CM | POA: Insufficient documentation

## 2021-10-16 DIAGNOSIS — R7989 Other specified abnormal findings of blood chemistry: Secondary | ICD-10-CM | POA: Insufficient documentation

## 2021-10-16 DIAGNOSIS — K760 Fatty (change of) liver, not elsewhere classified: Secondary | ICD-10-CM | POA: Diagnosis not present

## 2021-10-16 DIAGNOSIS — Z0000 Encounter for general adult medical examination without abnormal findings: Secondary | ICD-10-CM | POA: Diagnosis not present

## 2021-10-16 DIAGNOSIS — G4709 Other insomnia: Secondary | ICD-10-CM

## 2021-10-16 DIAGNOSIS — E782 Mixed hyperlipidemia: Secondary | ICD-10-CM

## 2021-10-16 MED ORDER — ZOLPIDEM TARTRATE 10 MG PO TABS: 10.0000 mg | ORAL_TABLET | Freq: Every evening | ORAL | 5 refills | Status: DC | PRN

## 2021-10-16 MED ORDER — TESTOSTERONE 1.62 % TD GEL: TRANSDERMAL | 5 refills | Status: DC

## 2021-10-16 MED ORDER — LISINOPRIL 40 MG PO TABS: 40.0000 mg | ORAL_TABLET | Freq: Every day | ORAL | 1 refills | Status: DC

## 2021-10-16 MED ORDER — EZETIMIBE 10 MG PO TABS: 10.0000 mg | ORAL_TABLET | Freq: Every day | ORAL | 1 refills | Status: DC

## 2021-10-16 MED ORDER — CYCLOBENZAPRINE HCL 10 MG PO TABS: 10.0000 mg | ORAL_TABLET | Freq: Three times a day (TID) | ORAL | 2 refills | Status: DC | PRN

## 2021-10-16 NOTE — Progress Notes (Unsigned)
Subjective:  Patient ID: David Murphy, male    DOB: March 05, 1971  Age: 51 y.o. MRN: [id number]  Chief Complaint  Patient presents with   Annual Exam    HPI  Well Adult Physical: Patient here for a comprehensive physical exam.The patient reports no problems Do you take any herbs or supplements that were not prescribed by a doctor? no Are you taking calcium supplements? no Are you taking aspirin daily? no  Encounter for general adult medical examination without abnormal findings  Physical ("At Risk" items are starred): Patient's last physical exam was 1 year ago .  Patient wears a seat belt, has smoke detectors, has carbon monoxide detectors, practices appropriate gun safety, and wears sunscreen with extended sun exposure. Dental Care: biannual cleanings, brushes and flosses daily. Ophthalmology/Optometry: Annual visit.  Hearing loss: yes.  Vision impairments: none    Garment/textile technologist Visit from 10/16/2021 in [facility]  PHQ-2 Total Score 0               Social History   Socioeconomic History   Marital status: Divorced    Spouse name: Not on file   Number of children: 1   Years of education: Not on file   Highest education level: Not on file  Occupational History   Occupation: Delivery Driver    Comment: [employer]  Tobacco Use   Smoking status: Never   Smokeless tobacco: Never  Substance and Sexual Activity   Alcohol use: Yes    Comment: Alcoholic, currently not in treatment   Drug use: Never   Sexual activity: Not on file  Other Topics Concern   Not on file  Social History Narrative   Not on file   Social Determinants of Health   Financial Resource Strain: Not on file  Food Insecurity: Not on file  Transportation Needs: Not on file  Physical Activity: Not on file  Stress: Not on file  Social Connections: Not on file   Past Medical History:  Diagnosis Date   Acute hepatitis B without delta-agent and without hepatic coma    Alcoholic (HCC)     Essential hypertension    Mixed hyperlipidemia    Primary insomnia    Testicular hypofunction    Past Surgical History:  Procedure Laterality Date   HERNIA REPAIR      Family History  Problem Relation Age of Onset   Hyperlipidemia Mother    Diabetes Mother    Hyperlipidemia Father    Social History   Socioeconomic History   Marital status: Divorced    Spouse name: Not on file   Number of children: 1   Years of education: Not on file   Highest education level: Not on file  Occupational History   Occupation: Delivery Driver    Comment: [employer]  Tobacco Use   Smoking status: Never   Smokeless tobacco: Never  Substance and Sexual Activity   Alcohol use: Yes    Comment: Alcoholic, currently not in treatment   Drug use: Never   Sexual activity: Not on file  Other Topics Concern   Not on file  Social History Narrative   Not on file   Social Determinants of Health   Financial Resource Strain: Not on file  Food Insecurity: Not on file  Transportation Needs: Not on file  Physical Activity: Not on file  Stress: Not on file  Social Connections: Not on file   Review of Systems  Constitutional:  Negative for chills, fatigue and fever.  HENT:  Negative for congestion, ear pain and sore throat.   Respiratory:  Negative for cough and shortness of breath.   Cardiovascular:  Negative for chest pain and leg swelling.  Gastrointestinal:  Negative for abdominal pain, constipation, diarrhea, nausea and vomiting.  Genitourinary:  Negative for dysuria and frequency.  Musculoskeletal:  Negative for arthralgias and myalgias.  Neurological:  Negative for dizziness and headaches.  Psychiatric/Behavioral:  Negative for dysphoric mood. The patient is not nervous/anxious.     Objective:  BP (!) 118/98 (BP Location: Left Arm, Patient Position: Sitting)    Pulse 86    Temp 98.2 F (36.8 C) (Temporal)    Ht 6' (1.829 m)    Wt 225 lb (102.1 kg)    SpO2 99%    BMI 30.52 kg/m    BP/Weight 10/16/2021 06/05/2021 05/08/2021  Systolic BP 118 124 144  Diastolic BP 98 82 94  Wt. (Lbs) 225 231 236  BMI 30.52 31.33 32.01    Physical Exam Vitals reviewed.  Constitutional:      Appearance: Normal appearance. He is normal weight.  HENT:     Right Ear: Tympanic membrane, ear canal and external ear normal.     Left Ear: Tympanic membrane, ear canal and external ear normal.     Nose: Nose normal. No congestion or rhinorrhea.     Mouth/Throat:     Mouth: Mucous membranes are moist.     Pharynx: No oropharyngeal exudate or posterior oropharyngeal erythema.  Neck:     Vascular: No carotid bruit.  Cardiovascular:     Rate and Rhythm: Normal rate and regular rhythm.     Pulses: Normal pulses.     Heart sounds: Normal heart sounds.  Pulmonary:     Effort: Pulmonary effort is normal. No respiratory distress.     Breath sounds: Normal breath sounds. No wheezing, rhonchi or rales.  Abdominal:     General: Abdomen is flat. Bowel sounds are normal.     Palpations: Abdomen is soft.     Tenderness: There is no abdominal tenderness.  Skin:    General: Skin is warm.     Findings: Lesion (cyst on upper back.) present. No rash.  Neurological:     Mental Status: He is alert and oriented to person, place, and time.  Psychiatric:        Mood and Affect: Mood normal.        Behavior: Behavior normal.    Lab Results  Component Value Date   WBC 8.6 06/05/2021   HGB 16.0 06/05/2021   HCT 47.0 06/05/2021   PLT 250 06/05/2021   GLUCOSE 101 (H) 06/05/2021   CHOL 279 (H) 04/24/2021   TRIG 398 (H) 04/24/2021   HDL 47 04/24/2021   LDLCALC 157 (H) 04/24/2021   ALT 211 (HH) 06/05/2021   AST 81 (H) 06/05/2021   NA 140 06/05/2021   K 4.3 06/05/2021   CL 101 06/05/2021   CREATININE 0.96 06/05/2021   BUN 13 06/05/2021   CO2 24 06/05/2021   TSH 2.120 12/16/2020      Assessment & Plan:   Problem List Items Addressed This Visit   None Visit Diagnoses     Encounter for  general adult medical examination without abnormal findings    -  Primary       Body mass index is 30.52 kg/m.   These are the goals we discussed:  Goals   None      This is a list of the screening recommended  for you and due dates:  Health Maintenance  Topic Date Due   HIV Screening  Never done   Tetanus Vaccine  Never done   Colon Cancer Screening  Never done   Flu Shot  11/10/2021*   Hepatitis C Screening: USPSTF Recommendation to screen - Ages 18-79 yo.  Completed   HPV Vaccine  Aged Out   COVID-19 Vaccine  Discontinued  *Topic was postponed. The date shown is not the original due date.     No orders of the defined types were placed in this encounter.    Follow-up: No follow-ups on file.  An After Visit Summary was printed and given to the patient.    I,Bernie Ransford M Nautika Cressey,acting as a scribe for Blane Ohara, MD.,have documented all relevant documentation on the behalf of Blane Ohara, MD,as directed by  Blane Ohara, MD while in the presence of Blane Ohara, MD.    Blane Ohara, MD [facility] [phone]

## 2021-10-16 NOTE — Patient Instructions (Signed)
Things to do to keep yourself healthy  ?- Exercise at least 30-45 minutes a day, 3-4 days a week.  ?- Eat a low-fat diet with lots of fruits and vegetables, up to 7-9 servings per day.  ?- Seatbelts can save your life. Wear them always.  ?- Smoke detectors on every level of your home, check batteries every year.  ?- Eye Doctor - have an eye exam every 1-2 years  ?- Safe sex - if you may be exposed to STDs, use a condom.  ?- Alcohol -  If you drink, do it moderately, less than 2 drinks per day.  ?- Health Care Power of Attorney. Choose someone to speak for you if you are not able.  ?- Depression is common in our stressful world.If you're feeling down or losing interest in things you normally enjoy, please come in for a visit.  ?- Violence - If anyone is threatening or hurting you, please call immediately.  ? ?Refills sent.  ?

## 2021-10-17 ENCOUNTER — Telehealth: Payer: Self-pay | Admitting: Family Medicine

## 2021-10-17 NOTE — Telephone Encounter (Signed)
CALLED PT DUE TO INUSRANCE BEING OUTDATED TO 2018 FROM WHAT WE HAVE ON COPY. [facility] IS STATING THEY ARE NOT IN NETWORK WITH [facility]. NEEDING UPDATED CARD FOR MEDICATION PURPOSES. LEFT VOICEMAIL FOR PT TO CALL us BACK.  ?

## 2021-10-24 LAB — COMPREHENSIVE METABOLIC PANEL
ALT: 285 IU/L (ref 0–44)
AST: 103 IU/L — ABNORMAL HIGH (ref 0–40)
Albumin/Globulin Ratio: 2.5 — ABNORMAL HIGH (ref 1.2–2.2)
Albumin: 5.2 g/dL — ABNORMAL HIGH (ref 4.0–5.0)
Alkaline Phosphatase: 103 IU/L (ref 44–121)
BUN/Creatinine Ratio: 13 (ref 9–20)
BUN: 14 mg/dL (ref 6–24)
Bilirubin Total: 0.4 mg/dL (ref 0.0–1.2)
CO2: 24 mmol/L (ref 20–29)
Calcium: 9.9 mg/dL (ref 8.7–10.2)
Chloride: 101 mmol/L (ref 96–106)
Creatinine, Ser: 1.08 mg/dL (ref 0.76–1.27)
Globulin, Total: 2.1 g/dL (ref 1.5–4.5)
Glucose: 89 mg/dL (ref 70–99)
Potassium: 4.8 mmol/L (ref 3.5–5.2)
Sodium: 142 mmol/L (ref 134–144)
Total Protein: 7.3 g/dL (ref 6.0–8.5)
eGFR: 84 mL/min/{1.73_m2} (ref >59)

## 2021-10-24 LAB — CBC WITH DIFF/PLATELET
Basophils Absolute: 0.1 10*3/uL (ref 0.0–0.2)
Basos: 1 %
EOS (ABSOLUTE): 0.2 10*3/uL (ref 0.0–0.4)
Eos: 2 %
Hematocrit: 49.2 % (ref 37.5–51.0)
Hemoglobin: 17 g/dL (ref 13.0–17.7)
Immature Grans (Abs): 0.1 10*3/uL (ref 0.0–0.1)
Immature Granulocytes: 1 %
Lymphocytes Absolute: 2 10*3/uL (ref 0.7–3.1)
Lymphs: 26 %
MCH: 30.8 pg (ref 26.6–33.0)
MCHC: 34.6 g/dL (ref 31.5–35.7)
MCV: 89 fL (ref 79–97)
Monocytes Absolute: 0.7 10*3/uL (ref 0.1–0.9)
Monocytes: 10 %
Neutrophils Absolute: 4.5 10*3/uL (ref 1.4–7.0)
Neutrophils: 60 %
Platelets: 213 10*3/uL (ref 150–450)
RBC: 5.52 x10E6/uL (ref 4.14–5.80)
RDW: 11.8 % (ref 11.6–15.4)
WBC: 7.5 10*3/uL (ref 3.4–10.8)

## 2021-10-24 LAB — LIPID PANEL
Chol/HDL Ratio: 4.3 ratio (ref 0.0–5.0)
Cholesterol, Total: 222 mg/dL — ABNORMAL HIGH (ref 100–199)
HDL: 52 mg/dL (ref >39)
LDL Chol Calc (NIH): 153 mg/dL — ABNORMAL HIGH (ref 0–99)
Triglycerides: 98 mg/dL (ref 0–149)
VLDL Cholesterol Cal: 17 mg/dL (ref 5–40)

## 2021-10-24 LAB — TESTOSTERONE,FREE AND TOTAL
Testosterone, Free: 17.3 pg/mL (ref 6.8–21.5)
Testosterone: 818 ng/dL (ref 264–916)

## 2021-10-24 LAB — FERRITIN: Ferritin: 541 ng/mL — ABNORMAL HIGH (ref 30–400)

## 2021-10-24 LAB — HEMOCHROMATOSIS DNA-PCR(C282Y,H63D)

## 2021-10-24 LAB — TSH: TSH: 2.86 u[IU]/mL (ref 0.450–4.500)

## 2021-10-26 ENCOUNTER — Other Ambulatory Visit: Payer: Self-pay

## 2021-10-29 ENCOUNTER — Encounter: Payer: Self-pay | Admitting: Family Medicine

## 2021-10-29 DIAGNOSIS — G4709 Other insomnia: Secondary | ICD-10-CM | POA: Insufficient documentation

## 2021-10-29 NOTE — Assessment & Plan Note (Signed)
cologuard ordered.

## 2021-10-29 NOTE — Assessment & Plan Note (Signed)
Check ferritin level.  ?Order hemochromatosis genetic test. 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  ?Referred to hematology to undergo phlebotomy. ?

## 2021-10-29 NOTE — Assessment & Plan Note (Signed)
Continue ambien

## 2021-10-29 NOTE — Assessment & Plan Note (Signed)
Continue testosterone gel. ?

## 2021-10-29 NOTE — Assessment & Plan Note (Signed)
Recommend continue to work on eating healthy diet and exercise.  

## 2021-10-29 NOTE — Assessment & Plan Note (Signed)
Not at goal. Reluctant to use statins due to elevated transamininases. ?Continue zetia 10 mg daily  ?Continue otc fish oil ?

## 2021-10-29 NOTE — Assessment & Plan Note (Signed)
Things to do to keep yourself healthy  ?- Exercise at least 30-45 minutes a day, 3-4 days a week.  ?- Eat a low-fat diet with lots of fruits and vegetables, up to 7-9 servings per day.  ?- Seatbelts can save your life. Wear them always.  ?- Smoke detectors on every level of your home, check batteries every year.  ?- Eye Doctor - have an eye exam every 1-2 years  ?- Safe sex - if you may be exposed to STDs, use a condom.  ?- Alcohol -  If you drink, do it moderately, less than 2 drinks per day.  ?- Health Care Power of [place]. Choose someone to speak for you if you are not able.  ?- Depression is common in our stressful world.If you're feeling down or losing interest in things you normally enjoy, please come in for a visit.  ?- Violence - If anyone is threatening or hurting you, please call immediately.  ? ?Refills sent.  ?

## 2021-10-29 NOTE — Assessment & Plan Note (Signed)
The current medical regimen is fairly effective;  continue present plan and medications. ?Continue lisinopril 40 mg daily.  ?

## 2021-10-29 NOTE — Assessment & Plan Note (Signed)
Fatty liver and hemochromatosis. ?

## 2021-10-30 ENCOUNTER — Other Ambulatory Visit: Payer: Self-pay | Admitting: Oncology

## 2021-10-30 ENCOUNTER — Other Ambulatory Visit: Payer: Commercial Managed Care - PPO

## 2021-10-30 ENCOUNTER — Ambulatory Visit: Payer: Commercial Managed Care - PPO | Admitting: Oncology

## 2021-10-30 DIAGNOSIS — R7989 Other specified abnormal findings of blood chemistry: Secondary | ICD-10-CM

## 2021-11-22 ENCOUNTER — Inpatient Hospital Stay: Payer: Commercial Managed Care - PPO | Attending: Oncology

## 2021-11-22 ENCOUNTER — Encounter: Payer: Self-pay | Admitting: Oncology

## 2021-11-22 ENCOUNTER — Other Ambulatory Visit: Payer: Self-pay

## 2021-11-22 ENCOUNTER — Inpatient Hospital Stay (INDEPENDENT_AMBULATORY_CARE_PROVIDER_SITE_OTHER): Payer: Commercial Managed Care - PPO | Admitting: Oncology

## 2021-11-22 ENCOUNTER — Other Ambulatory Visit: Payer: Self-pay | Admitting: Oncology

## 2021-11-22 DIAGNOSIS — D751 Secondary polycythemia: Secondary | ICD-10-CM

## 2021-11-22 DIAGNOSIS — R7989 Other specified abnormal findings of blood chemistry: Secondary | ICD-10-CM | POA: Diagnosis not present

## 2021-11-22 DIAGNOSIS — B1910 Unspecified viral hepatitis B without hepatic coma: Secondary | ICD-10-CM | POA: Diagnosis not present

## 2021-11-22 DIAGNOSIS — I10 Essential (primary) hypertension: Secondary | ICD-10-CM | POA: Diagnosis not present

## 2021-11-22 DIAGNOSIS — K760 Fatty (change of) liver, not elsewhere classified: Secondary | ICD-10-CM | POA: Insufficient documentation

## 2021-11-22 DIAGNOSIS — G4700 Insomnia, unspecified: Secondary | ICD-10-CM | POA: Diagnosis not present

## 2021-11-22 DIAGNOSIS — E291 Testicular hypofunction: Secondary | ICD-10-CM | POA: Insufficient documentation

## 2021-11-22 DIAGNOSIS — R748 Abnormal levels of other serum enzymes: Secondary | ICD-10-CM | POA: Diagnosis not present

## 2021-11-22 DIAGNOSIS — Z800 Family history of malignant neoplasm of digestive organs: Secondary | ICD-10-CM | POA: Insufficient documentation

## 2021-11-22 DIAGNOSIS — E782 Mixed hyperlipidemia: Secondary | ICD-10-CM | POA: Diagnosis not present

## 2021-11-22 DIAGNOSIS — Z79899 Other long term (current) drug therapy: Secondary | ICD-10-CM | POA: Insufficient documentation

## 2021-11-22 LAB — BASIC METABOLIC PANEL
BUN: 15 (ref 4–21)
CO2: 25 — AB (ref 13–22)
Chloride: 101 (ref 99–108)
Creatinine: 0.9 (ref 0.6–1.3)
Glucose: 95
Potassium: 4 mEq/L (ref 3.5–5.1)
Sodium: 140 (ref 137–147)

## 2021-11-22 LAB — CBC AND DIFFERENTIAL
HCT: 49 (ref 41–53)
HCT: 49 (ref 41–53)
Hemoglobin: 16 (ref 13.5–17.5)
Hemoglobin: 16 (ref 13.5–17.5)
Neutrophils Absolute: 5.23
Neutrophils Absolute: 5.23
Platelets: 224 10*3/uL (ref 150–400)
Platelets: 224 10*3/uL (ref 150–400)
WBC: 8.3
WBC: 8.3

## 2021-11-22 LAB — HEPATIC FUNCTION PANEL
ALT: 299 U/L — AB (ref 10–40)
AST: 117 — AB (ref 14–40)
Alkaline Phosphatase: 96 (ref 25–125)
Bilirubin, Total: 0.8

## 2021-11-22 LAB — IRON AND TIBC
Iron: 165 ug/dL (ref 45–182)
Saturation Ratios: 36 % (ref 17.9–39.5)
TIBC: 460 ug/dL — ABNORMAL HIGH (ref 250–450)
UIBC: 295 ug/dL

## 2021-11-22 LAB — COMPREHENSIVE METABOLIC PANEL
Albumin: 5.1 — AB (ref 3.5–5.0)
Calcium: 9.8 (ref 8.7–10.7)

## 2021-11-22 LAB — CBC
RBC: 5.4 — AB (ref 3.87–5.11)
RBC: 5.4 — AB (ref 3.87–5.11)

## 2021-11-22 LAB — FERRITIN: Ferritin: 365 ng/mL — ABNORMAL HIGH (ref 24–336)

## 2021-12-03 NOTE — Progress Notes (Addendum)
?Addendum: His iron level is normal at 165 but the TIBC is elevated at 460 for a high normal saturation of 36%.  His repeat ferritin is better at 365. ? ? ?[name]  ?[address] ?[place],  [name] ?[phone] ? ?Clinic Day: 11/22/21 ? ?Referring physician: Blane Ohara, MD ? ? ?ASSESSMENT & PLAN:  ? 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  I would recommend monitoring his ferritin and would not pursue phlebotomy at this time. ? 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  It is likely from the testosterone and may be improved because he has not been taking that lately.  If his hematocrit were to go over 50, I would still consider phlebotomy for that.  It does make me question whether he could have sleep apnea especially since he has problems with sleep disturbance.  He has no signs of polycythemia vera such as splenomegaly, leukocytosis or thrombocytosis.  If his hemoglobin increases in the absence of testosterone, we will check for a JAK2 mutation.  I think this just needs to be monitored and would not pursue a bone marrow at this time. ? ?Elevated liver transaminases ?Most likely this is from alcohol and fatty liver, especially in someone who has previously had hepatitis B.  I doubt this is from iron overload but the only way to prove that would be a liver biopsy.  I do not feel that is indicated at this time. ? ?I will defer to Dr. Sedalia Muta as to whether a sleep study would be beneficial.  He may have sleep apnea but the polycythemia could also be from the testosterone supplementation.  I do not know if there is a way that he could have a overnight pulse oximetry monitored to see if his oxygen levels drop.  Since his hemoglobin is down to 16 today, in the normal range, I do not see a need to pursue this aggressively.  I did recommend that he at  least cut down on his alcohol and he really should quit drinking altogether as he is going to aggravate the fatty liver and put himself at risk for liver cirrhosis.  I do not have high hopes that he will quit drinking but perhaps he will consider it.  If his hemoglobin continues to increase in the absence of testosterone, I can check for a JAK2 mutation, as this is elevated in nearly 100% of patients with polycythemia vera.  I will call him with the rest of the labs.  I do recommend a repeat colonoscopy after age 69.  He did have one many years ago in [name] but does have a positive family history.  At this time I will simply recommend monitoring these parameters and will see him back in 4 months with CBC, comprehensive metabolic profile, ferritin, iron, and TIBC. I discussed the assessment and treatment plan with the patient.  The patient was provided an opportunity to ask questions and all were answered.  The patient agreed with the plan and demonstrated an understanding of the instructions.  The patient was advised to call back if the symptoms worsen or if the condition worsens. ? ?Thank you for the opportunity to participate in the care of your patients. ? ?I provided 45 minutes of face-to-face time during this this encounter and > 50% was spent counseling as documented under my assessment and plan.  ? ? ?  [name], MD ?[name] ?[name] ?[name] ?[name] ?Dept: [phone] ?Dept Fax: [phone]  ? ?CHIEF COMPLAINT:  ?CC: Hemochromatosis ? ?Current Treatment: Surveillance ? ? ?HISTORY OF PRESENT ILLNESS:  ?David Murphy is a 51 y.o. male with a history of an elevated ferritin of 748 in October 2022.  He had abnormal liver function tests at that time and hepatitis panel was negative.  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  He is referred in  consultation from Dr. [name] for assessment and management.  This is an autosomal recessive iron storage disorder and the heterozygous form usually does not require phlebotomy but Dr. Sedalia Mutaox was concerned as to whether this patient would require this.  His ferritin dropped to 481 in January and 541 two weeks ago.  His liver transaminases remain abnormal with the last readings on March 6 of an SGOT of 103 and SGPT of 285 with normal alkaline phosphatase and total bilirubin.  This is felt to be from fatty liver but he does have a history of acute hepatitis B 5 years ago.  Both his mother and son have also been diagnosed with fatty liver but no blood dyscrasias run in the family and no history of hemochromatosis.  He has additional problem of polycythemia with a hemoglobin of 17.0 and hematocrit of 49.2 on March 6 with an MCV of 91, normal white count, normal platelet count, and normal differential.  He has been on testosterone gel on and off but not much recently.  He has been diagnosed as having low testosterone levels and request that I check that today.  He does not smoke and does not have COPD but I suspect he may have sleep apnea.  He does admit that he drinks alcohol daily with 4-5 beers and occasional hard liquor. ? ?INTERVAL HISTORY:  ?I have reviewed his chart and materials related to his blood extensively and collaborated history with the patient. ?Oncology History  ? No history exists.  ? ? ?David Murphy is seen in the clinic for follow up of his hemochromatosis trait.  He continues to have abnormal liver function tests but also continues to drink daily alcohol.  He does have fatty liver and hyperlipidemia as well as a history of acute hepatitis B.  There is no easy way to assess whether his liver transaminases are abnormal from hemochromatosis, but this is not usually a problem with the heterozygous form.  The definitive way would be a liver biopsy which I do not feel is necessary at this time.  His ferritin  has decreased over the last 6 months but we will recheck this today along with his liver function tests.  The ferritin is pending but his SGOT is 117 and SGPT is 299 with normal bilirubin.  He also has a high total protein of 8.3 with a high albumin of 5.1.  The rest of his CMP is normal and his CBC is normal with his hemoglobin down to 16.0 with hematocrit of 48.8.  He is not taking the testosterone gel regularly and especially not recently.  He complains that he does not sleep well and does not know if he snores but does take Ambien to help him sleep.  He has never had a sleep study done. He denies fever, chills, night sweats, or other signs of infection. He denies cardiorespiratory and gastrointestinal issues. He  denies pain. His  appetite is good and he denies weight loss. ? ?HISTORY:  ? ?Past Medical History:  ?Diagnosis Date  ? Acute hepatitis B without delta-agent and without hepatic coma   ? Essential hypertension   ? Mixed hyperlipidemia   ? Primary insomnia   ? Testicular hypofunction   ? ? ?Past Surgical History:  ?Procedure Laterality Date  ? HERNIA REPAIR    ?He had a hemangioma resected from his chin at age 8. ? ?Family History  ?Problem Relation Age of Onset  ? Hyperlipidemia Mother   ? Diabetes Mother   ? Hyperlipidemia Father   ?Paternal great aunt had colon cancer ?Paternal great grandmother had colon cancer ? ?Social History:  reports that he has never smoked. He has never used smokeless tobacco. He reports current alcohol use of about 35.0 standard drinks per week. He reports that he does not use drugs.The patient is alone and single today.  He is divorced and has 1 son.  He did admit to me of drinking 4-5 beers daily with occasional additional alcohol.  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 ? ?Allergies: No Known Allergies ? ?Current Medications: ?Current Outpatient Medications  ?Medication Sig Dispense Refill  ? cyclobenzaprine (FLEXERIL) 10 MG tablet Take 1 tablet (10 mg total) by mouth 3  (three) times daily as needed. for muscle spams 90 tablet 2  ? ezetimibe (ZETIA) 10 MG tablet Take 1 tablet (10 mg total) by mouth daily. 90 tablet 1  ? hydrocortisone (ANUSOL-HC) 25 MG suppository Place

## 2021-12-05 ENCOUNTER — Telehealth: Payer: Self-pay

## 2021-12-05 NOTE — Telephone Encounter (Signed)
Reached patient. He VU but would rather not proceed with home sleep study. He did not have a particular reason for this. But does not want to proceed.  ? ?David Murphy 12/05/21 1:18 PM ? ?

## 2021-12-05 NOTE — Telephone Encounter (Signed)
Patient notified

## 2021-12-05 NOTE — Telephone Encounter (Signed)
-----   Message from [name], MD sent at 12/03/2021  4:29 PM EDT ----- ?Regarding: call ?Tell him ferritin is better at 365 so just plan to monitor for now.  His iron levels are still high normal so would avoid iron supplement. ? ?

## 2021-12-05 NOTE — Telephone Encounter (Signed)
-----   Message from Blane Ohara, MD sent at 12/04/2021 11:17 PM EDT ----- ?Regarding: Assessment for OSA. ?I reviewed dr. Lavonda Jumbo evaluation and I would recommend a home sleep study. Please ask if he is willing to have this study. Dr Sedalia Muta  ? ?

## 2021-12-05 NOTE — Telephone Encounter (Signed)
Attempted to call number listed x2, call could not be completed both times.  ? ?David Murphy 12/05/21 9:25 AM ? ?

## 2021-12-13 ENCOUNTER — Encounter: Payer: Self-pay | Admitting: *Deleted

## 2022-01-20 NOTE — Progress Notes (Unsigned)
Subjective:  Patient ID: David Murphy, male    DOB: 05-Apr-1971  Age: 51 y.o. MRN: [id number]  Chief Complaint  Patient presents with   Hyperlipidemia   Hypertension    HPI Hyperlipidemia: Patient is taking Zetia 10 mg daily, Omega 3 Fish Oil 500mg  twice a day.  Hypertension: He takes Lisinopril 40 mg daily.  Testicular Hypofunction: Testosterone 1.62% gel apply 3 pumps topically once a day to clean, dry, intact skin.  Insomnia: Zolpidem 10 mg daily at bedtime.  Healthy diet. Moderate exercise.  Hemochromatosis is unlikely cause of elevated liver function test due to being heterozygous.  Per Dr. note appears to be a combination of history of hepatitis B, fatty liver, and alcohol.  Patient has significantly decreased his alcohol intake.  Elevated liver function test: Current Outpatient Medications on File Prior to Visit  Medication Sig Dispense Refill   cyclobenzaprine (FLEXERIL) 10 MG tablet Take 1 tablet (10 mg total) by mouth 3 (three) times daily as needed. for muscle spams 90 tablet 2   ezetimibe (ZETIA) 10 MG tablet Take 1 tablet (10 mg total) by mouth daily. 90 tablet 1   hydrocortisone (ANUSOL-HC) 25 MG suppository Place 1 suppository (25 mg total) rectally 2 (two) times daily. 12 suppository 2   lisinopril (ZESTRIL) 40 MG tablet Take 1 tablet (40 mg total) by mouth daily. 90 tablet 1   Omega-3 Fatty Acids (FISH OIL PO) Take 500 mg by mouth 2 (two) times daily.     Testosterone 1.62 % GEL APPLY 3 PUMPS TOPICALLY ONCE DAILY TO CLEAN, DRY, INTACT SKIN OF SHOULDERS AND UPPER ARMS 75 g 5   zolpidem (AMBIEN) 10 MG tablet Take 1 tablet (10 mg total) by mouth at bedtime as needed. 30 tablet 5   No current facility-administered medications on file prior to visit.   Past Medical History:  Diagnosis Date   Acute hepatitis B without delta-agent and without hepatic coma    Essential hypertension    Mixed hyperlipidemia    Primary insomnia    Testicular hypofunction     Past Surgical History:  Procedure Laterality Date   HERNIA REPAIR      Family History  Problem Relation Age of Onset   Hyperlipidemia Mother    Diabetes Mother    Hyperlipidemia Father    Social History   Socioeconomic History   Marital status: Divorced    Spouse name: Not on file   Number of children: 1   Years of education: Not on file   Highest education level: Not on file  Occupational History   Occupation: Delivery Driver    Comment: [employer]  Tobacco Use   Smoking status: Never   Smokeless tobacco: Never  Vaping Use   Vaping Use: Never used  Substance and Sexual Activity   Alcohol use: Yes    Alcohol/week: 35.0 standard drinks of alcohol    Types: 35 Cans of beer per week    Comment: Alcoholic, currently not in treatment, drink 4-5 cans of beer daily, some more   Drug use: Never   Sexual activity: Not Currently  Other Topics Concern   Not on file  Social History Narrative   Not on file   Social Determinants of Health   Financial Resource Strain: Not on file  Food Insecurity: Not on file  Transportation Needs: Not on file  Physical Activity: Insufficiently Active (10/16/2021)   Exercise Vital Sign    Days of Exercise per Week: 2 days    Minutes of  Exercise per Session: 20 min  Stress: Not on file  Social Connections: Not on file    Review of Systems  Constitutional:  Negative for chills, fatigue, fever and unexpected weight change.  HENT:  Negative for congestion, ear pain, sinus pain and sore throat.   Respiratory:  Negative for cough and shortness of breath.   Cardiovascular:  Negative for chest pain and palpitations.  Gastrointestinal:  Negative for abdominal pain, blood in stool, constipation, diarrhea, nausea and vomiting.  Endocrine: Negative for polydipsia.  Genitourinary:  Negative for dysuria.  Musculoskeletal:  Positive for back pain (controlled with cyclobenzaprine).  Skin:  Negative for rash.  Neurological:  Negative for headaches.     Objective:  BP 124/84   Pulse (!) 104   Temp 97.7 F (36.5 C)   Resp 16   Ht 6' (1.829 m)   Wt 228 lb (103.4 kg)   BMI 30.92 kg/m      01/22/2022    1:31 PM 11/22/2021    3:09 PM 10/16/2021    3:18 PM  BP/Weight  Systolic BP 124 152 118  Diastolic BP 84 96 98  Wt. (Lbs) 228 225.6 225  BMI 30.92 kg/m2 30.6 kg/m2 30.52 kg/m2    Physical Exam Vitals reviewed.  Constitutional:      Appearance: Normal appearance.  Neck:     Vascular: No carotid bruit.  Cardiovascular:     Rate and Rhythm: Normal rate and regular rhythm.     Heart sounds: Normal heart sounds.  Pulmonary:     Effort: Pulmonary effort is normal.     Breath sounds: Normal breath sounds. No wheezing, rhonchi or rales.  Abdominal:     General: Bowel sounds are normal.     Palpations: Abdomen is soft.     Tenderness: There is no abdominal tenderness.  Neurological:     Mental Status: He is alert and oriented to person, place, and time.  Psychiatric:        Mood and Affect: Mood normal.        Behavior: Behavior normal.     Diabetic Foot Exam - Simple   No data filed      Lab Results  Component Value Date   WBC 8.3 11/22/2021   WBC 8.3 11/22/2021   HGB 16.0 11/22/2021   HGB 16.0 11/22/2021   HCT 49 11/22/2021   HCT 49 11/22/2021   PLT 224 11/22/2021   PLT 224 11/22/2021   GLUCOSE 89 10/16/2021   CHOL 222 (H) 10/16/2021   TRIG 98 10/16/2021   HDL 52 10/16/2021   LDLCALC 153 (H) 10/16/2021   ALT 299 (A) 11/22/2021   AST 117 (A) 11/22/2021   NA 140 11/22/2021   K 4.0 11/22/2021   CL 101 11/22/2021   CREATININE 0.9 11/22/2021   BUN 15 11/22/2021   CO2 25 (A) 11/22/2021   TSH 2.860 10/16/2021      Assessment & Plan:   Problem List Items Addressed This Visit       Cardiovascular and Mediastinum   Essential hypertension - Primary    Well controlled.  No changes to medicines.  Continue to work on eating a healthy diet and exercise.  Labs drawn today.        Relevant Orders    Comprehensive metabolic panel   CBC with Differential/Platelet     Digestive   Fatty liver (Chronic)    Patient has decreased his alcohol intake.  Working on eating healthy.  Very active.  Endocrine   Testicular hypofunction    Held testosterone after his last LFTs were very elevated.  Patient to call his insurance company to find out which type of testosterone they cover was preferred on his formulary. Check PSA and testosterone levels today.      Relevant Orders   Testosterone,Free and Total   PSA     Other   Elevated liver enzymes (Chronic)    Multifactorial.  Check labs.      Other hemochromatosis (Chronic)    Heterozygous.       Polycythemia, secondary (Chronic)    Check CBC.      Mixed hyperlipidemia    Await labs/testing for recommendations. Continue Zetia and fish oil.   Continue working on eating healthy and exercise.   Checking labs today.      Relevant Orders   Lipid panel   Other insomnia    The current medical regimen is effective;  continue present plan and medications.      .  No orders of the defined types were placed in this encounter.   Orders Placed This Encounter  Procedures   Comprehensive metabolic panel   Lipid panel   CBC with Differential/Platelet   Testosterone,Free and Total   PSA     Follow-up: Return in about 3 months (around 04/24/2022) for chronic fasting.  An After Visit Summary was printed and given to the patient.  Blane Ohara, MD [facility] [phone]

## 2022-01-22 ENCOUNTER — Ambulatory Visit (INDEPENDENT_AMBULATORY_CARE_PROVIDER_SITE_OTHER): Payer: Commercial Managed Care - PPO | Admitting: Family Medicine

## 2022-01-22 ENCOUNTER — Encounter: Payer: Self-pay | Admitting: Family Medicine

## 2022-01-22 VITALS — BP 124/84 | HR 104 | Temp 97.7°F | Resp 16 | Ht 72.0 in | Wt 228.0 lb

## 2022-01-22 DIAGNOSIS — K760 Fatty (change of) liver, not elsewhere classified: Secondary | ICD-10-CM | POA: Diagnosis not present

## 2022-01-22 DIAGNOSIS — E291 Testicular hypofunction: Secondary | ICD-10-CM

## 2022-01-22 DIAGNOSIS — E782 Mixed hyperlipidemia: Secondary | ICD-10-CM

## 2022-01-22 DIAGNOSIS — D751 Secondary polycythemia: Secondary | ICD-10-CM

## 2022-01-22 DIAGNOSIS — G4709 Other insomnia: Secondary | ICD-10-CM

## 2022-01-22 DIAGNOSIS — I10 Essential (primary) hypertension: Secondary | ICD-10-CM

## 2022-01-22 DIAGNOSIS — R748 Abnormal levels of other serum enzymes: Secondary | ICD-10-CM

## 2022-01-22 NOTE — Assessment & Plan Note (Signed)
The current medical regimen is effective;  continue present plan and medications.  

## 2022-01-22 NOTE — Assessment & Plan Note (Signed)
Await labs/testing for recommendations. Continue Zetia and fish oil.   Continue working on eating healthy and exercise.   Checking labs today.

## 2022-01-22 NOTE — Assessment & Plan Note (Signed)
Patient has decreased his alcohol intake.  Working on eating healthy.  Very active.

## 2022-01-22 NOTE — Assessment & Plan Note (Signed)
Check CBC 

## 2022-01-22 NOTE — Assessment & Plan Note (Signed)
Well controlled.  ?No changes to medicines.  ?Continue to work on eating a healthy diet and exercise.  ?Labs drawn today.  ?

## 2022-01-22 NOTE — Assessment & Plan Note (Signed)
Held testosterone after his last LFTs were very elevated.  Patient to call his insurance company to find out which type of testosterone they cover was preferred on his formulary. Check PSA and testosterone levels today.

## 2022-01-22 NOTE — Assessment & Plan Note (Signed)
Heterozygous

## 2022-01-22 NOTE — Assessment & Plan Note (Signed)
Multifactorial.  Check labs.

## 2022-01-25 ENCOUNTER — Other Ambulatory Visit: Payer: Self-pay

## 2022-01-25 LAB — COMPREHENSIVE METABOLIC PANEL
ALT: 225 IU/L (ref 0–44)
AST: 99 IU/L — ABNORMAL HIGH (ref 0–40)
Albumin/Globulin Ratio: 2.4 — ABNORMAL HIGH (ref 1.2–2.2)
Albumin: 5 g/dL (ref 4.0–5.0)
Alkaline Phosphatase: 93 IU/L (ref 44–121)
BUN/Creatinine Ratio: 9 (ref 9–20)
BUN: 9 mg/dL (ref 6–24)
Bilirubin Total: 0.4 mg/dL (ref 0.0–1.2)
CO2: 25 mmol/L (ref 20–29)
Calcium: 9.8 mg/dL (ref 8.7–10.2)
Chloride: 98 mmol/L (ref 96–106)
Creatinine, Ser: 1.04 mg/dL (ref 0.76–1.27)
Globulin, Total: 2.1 g/dL (ref 1.5–4.5)
Glucose: 92 mg/dL (ref 70–99)
Potassium: 4.9 mmol/L (ref 3.5–5.2)
Sodium: 137 mmol/L (ref 134–144)
Total Protein: 7.1 g/dL (ref 6.0–8.5)
eGFR: 88 mL/min/{1.73_m2} (ref >59)

## 2022-01-25 LAB — CBC WITH DIFFERENTIAL/PLATELET
Basophils Absolute: 0.1 10*3/uL (ref 0.0–0.2)
Basos: 1 %
EOS (ABSOLUTE): 0.3 10*3/uL (ref 0.0–0.4)
Eos: 4 %
Hematocrit: 41.9 % (ref 37.5–51.0)
Hemoglobin: 14.4 g/dL (ref 13.0–17.7)
Immature Grans (Abs): 0.1 10*3/uL (ref 0.0–0.1)
Immature Granulocytes: 1 %
Lymphocytes Absolute: 2 10*3/uL (ref 0.7–3.1)
Lymphs: 25 %
MCH: 31 pg (ref 26.6–33.0)
MCHC: 34.4 g/dL (ref 31.5–35.7)
MCV: 90 fL (ref 79–97)
Monocytes Absolute: 0.6 10*3/uL (ref 0.1–0.9)
Monocytes: 8 %
Neutrophils Absolute: 5 10*3/uL (ref 1.4–7.0)
Neutrophils: 61 %
Platelets: 222 10*3/uL (ref 150–450)
RBC: 4.65 x10E6/uL (ref 4.14–5.80)
RDW: 12.5 % (ref 11.6–15.4)
WBC: 8 10*3/uL (ref 3.4–10.8)

## 2022-01-25 LAB — LIPID PANEL
Chol/HDL Ratio: 5.3 ratio — ABNORMAL HIGH (ref 0.0–5.0)
Cholesterol, Total: 256 mg/dL — ABNORMAL HIGH (ref 100–199)
HDL: 48 mg/dL (ref >39)
LDL Chol Calc (NIH): 173 mg/dL — ABNORMAL HIGH (ref 0–99)
Triglycerides: 188 mg/dL — ABNORMAL HIGH (ref 0–149)
VLDL Cholesterol Cal: 35 mg/dL (ref 5–40)

## 2022-01-25 LAB — CARDIOVASCULAR RISK ASSESSMENT

## 2022-01-25 LAB — PSA: Prostate Specific Ag, Serum: 1.9 ng/mL (ref 0.0–4.0)

## 2022-01-25 LAB — TESTOSTERONE,FREE AND TOTAL
Testosterone, Free: 4.7 pg/mL — ABNORMAL LOW (ref 6.8–21.5)
Testosterone: 224 ng/dL — ABNORMAL LOW (ref 264–916)

## 2022-01-25 MED ORDER — ICOSAPENT ETHYL 1 G PO CAPS: 2.0000 g | ORAL_CAPSULE | Freq: Two times a day (BID) | ORAL | 1 refills | Status: DC

## 2022-03-26 ENCOUNTER — Ambulatory Visit: Payer: Commercial Managed Care - PPO | Admitting: Oncology

## 2022-03-26 ENCOUNTER — Other Ambulatory Visit: Payer: Commercial Managed Care - PPO

## 2022-03-26 ENCOUNTER — Other Ambulatory Visit: Payer: Self-pay | Admitting: Family Medicine

## 2022-04-29 ENCOUNTER — Other Ambulatory Visit: Payer: Self-pay | Admitting: Family Medicine

## 2022-04-29 DIAGNOSIS — G4709 Other insomnia: Secondary | ICD-10-CM

## 2022-04-30 ENCOUNTER — Other Ambulatory Visit: Payer: Commercial Managed Care - PPO

## 2022-04-30 ENCOUNTER — Ambulatory Visit: Payer: Commercial Managed Care - PPO | Admitting: Oncology

## 2022-06-19 ENCOUNTER — Other Ambulatory Visit: Payer: Self-pay | Admitting: Family Medicine

## 2022-06-19 DIAGNOSIS — E782 Mixed hyperlipidemia: Secondary | ICD-10-CM

## 2022-07-11 ENCOUNTER — Other Ambulatory Visit: Payer: Self-pay | Admitting: Family Medicine

## 2022-07-11 DIAGNOSIS — E291 Testicular hypofunction: Secondary | ICD-10-CM

## 2022-08-18 ENCOUNTER — Other Ambulatory Visit: Payer: Self-pay | Admitting: Family Medicine

## 2022-08-18 DIAGNOSIS — G4709 Other insomnia: Secondary | ICD-10-CM

## 2022-08-19 NOTE — Telephone Encounter (Signed)
Please call for a fasting appointment. Dr. Alysse Rathe  

## 2022-08-24 ENCOUNTER — Telehealth: Payer: Self-pay

## 2022-08-24 NOTE — Telephone Encounter (Signed)
Error

## 2022-08-24 NOTE — Telephone Encounter (Signed)
I tried to contact the patient today to see about getting him scheduled for a fasting appointment. I left a message for the patient to call the office back. If the patient calls back please schedule the appointment and please notify him of his EPIC balance again.  My last note about his balance is:06.12.23 - pt is aware of his $100 balance he is aware he may be asked to pay at least half before he is seen again or if he can set up a payment plan.

## 2022-08-30 IMAGING — US US ABDOMEN LIMITED
1 series · 14 of 25 positions shown · non-contrast
Comparison: None.

CLINICAL DATA: Initial evaluation for elevated liver enzymes.

EXAM:
ULTRASOUND ABDOMEN LIMITED RIGHT UPPER QUADRANT

[Series 1: us abdomen limited · 0.34mm/px · 14 of 42 slices shown]
[im 1/42]
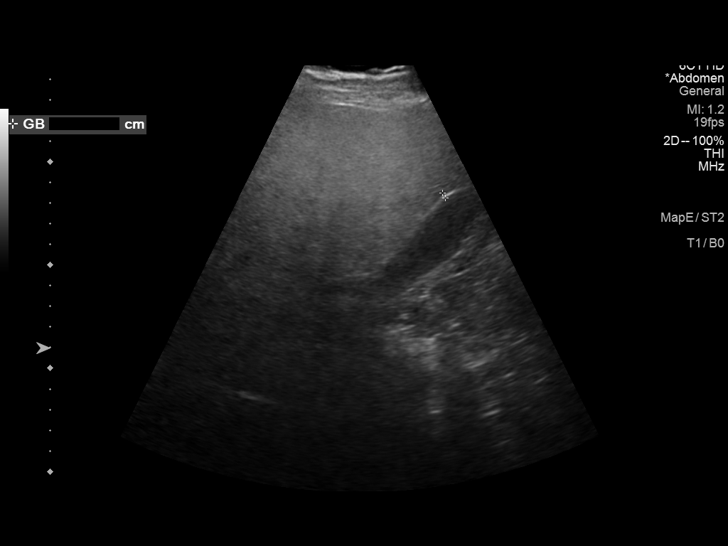
[im 4/42]
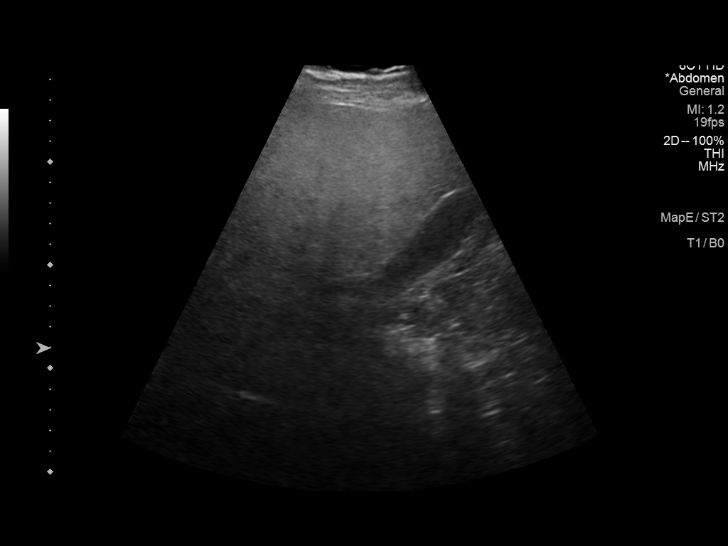
[im 7/42]
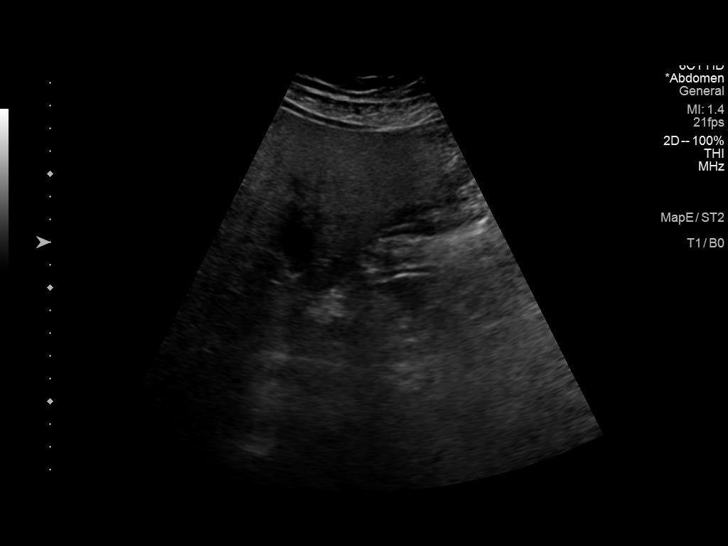
[im 11/42]
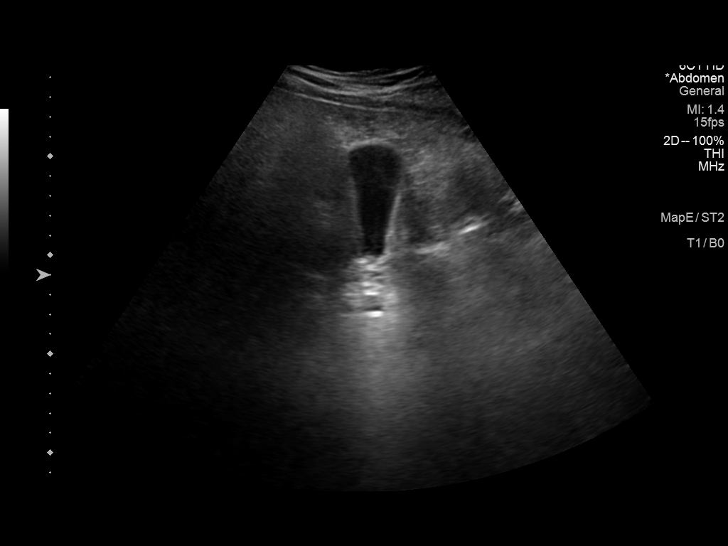
[im 14/42]
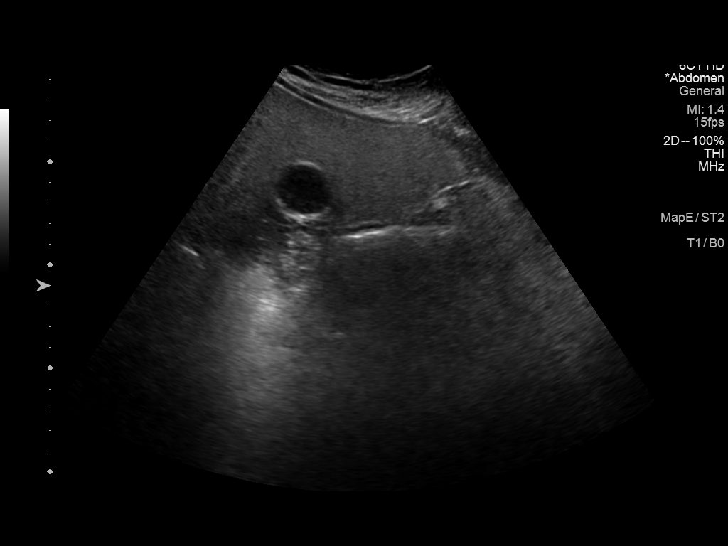
[im 16/42]
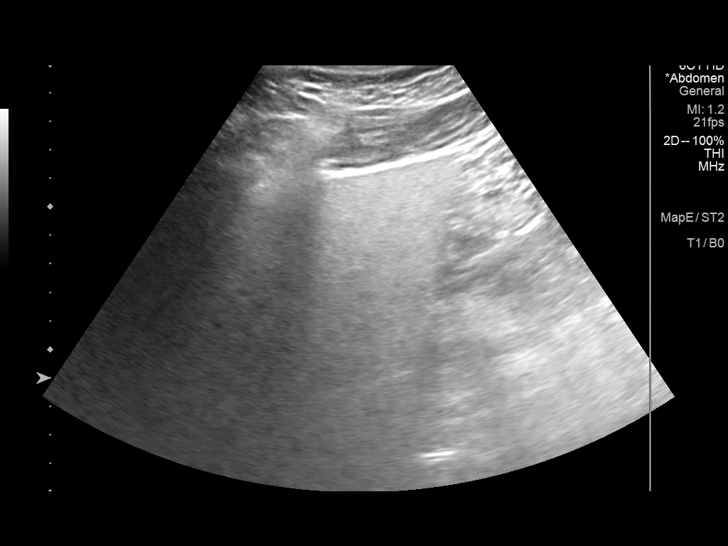
[im 19/42]
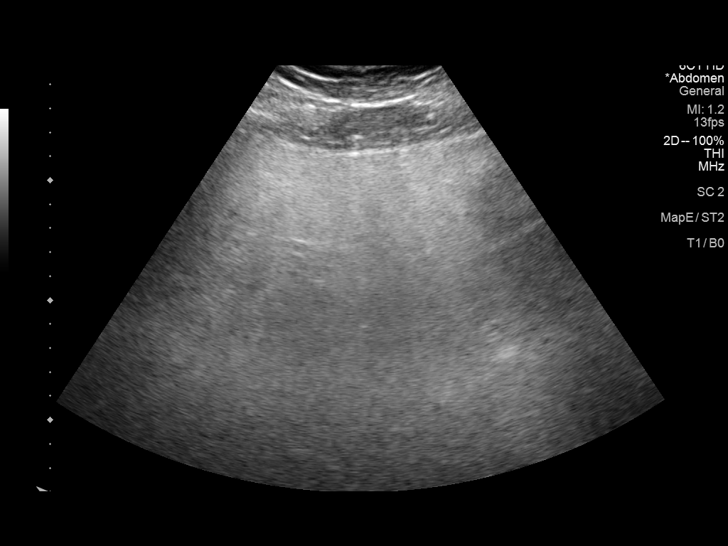
[im 23/42]
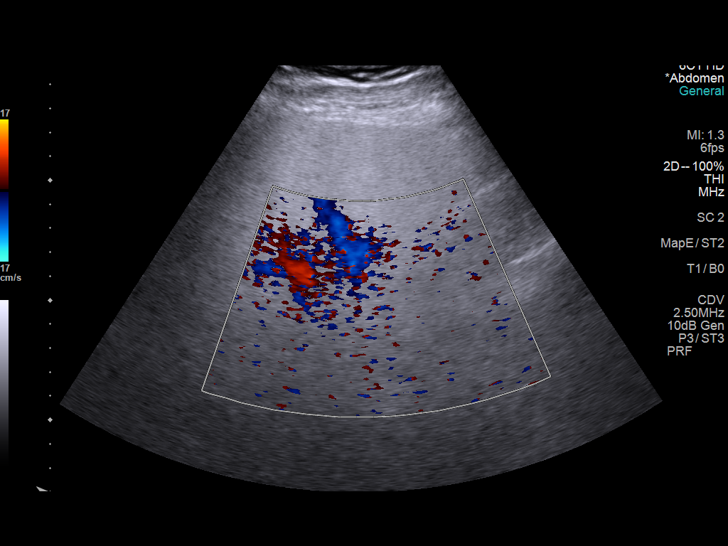
[im 26/42]
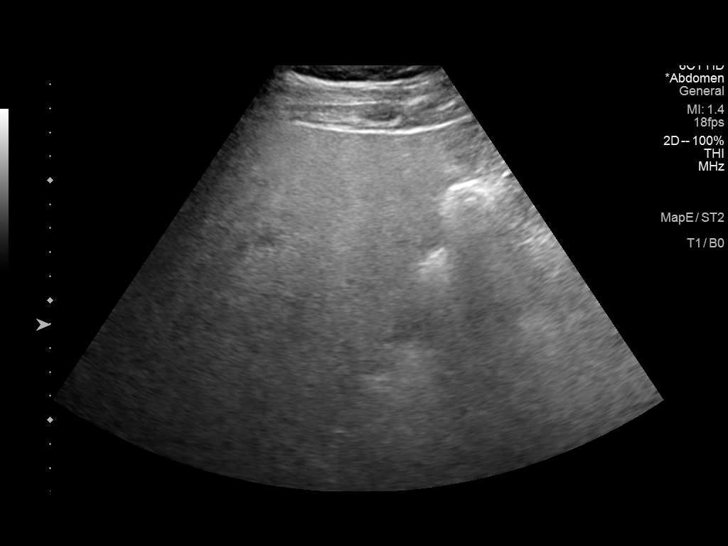
[im 28/42]
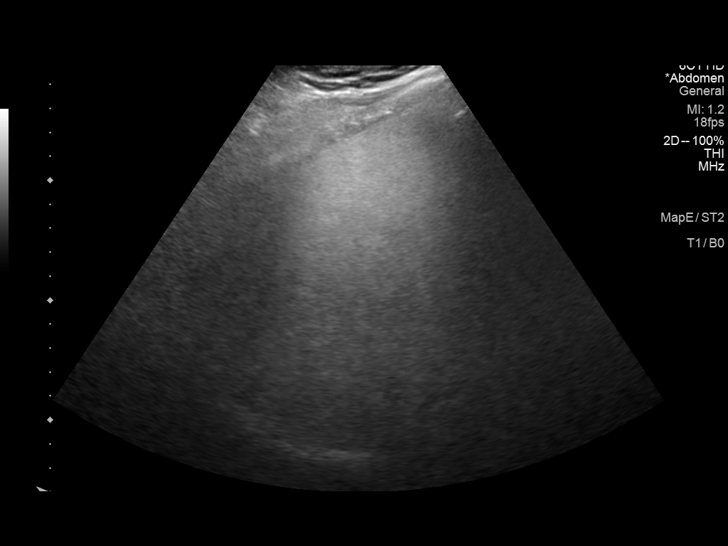
[im 31/42]
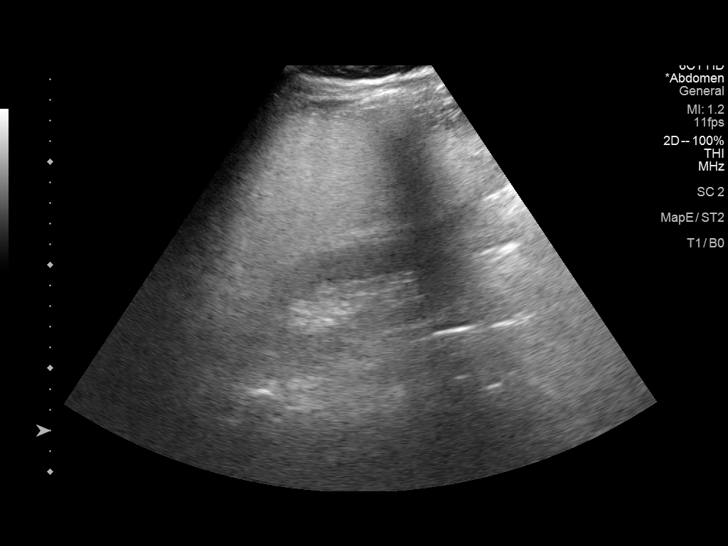
[im 35/42]
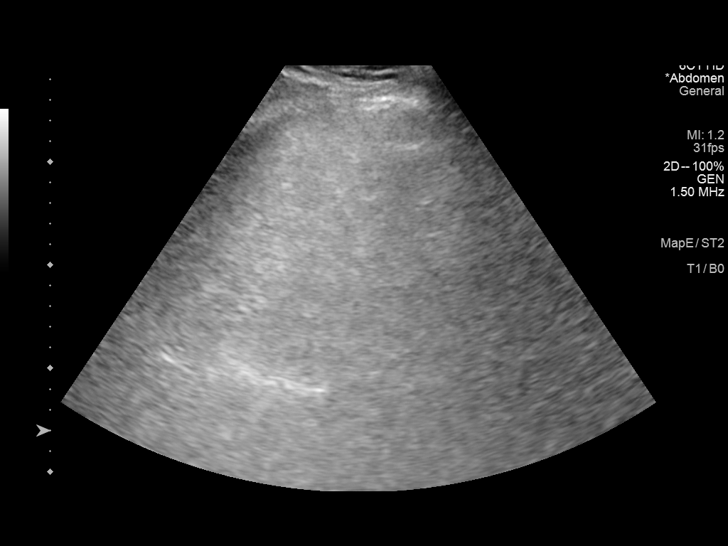
[im 38/42]
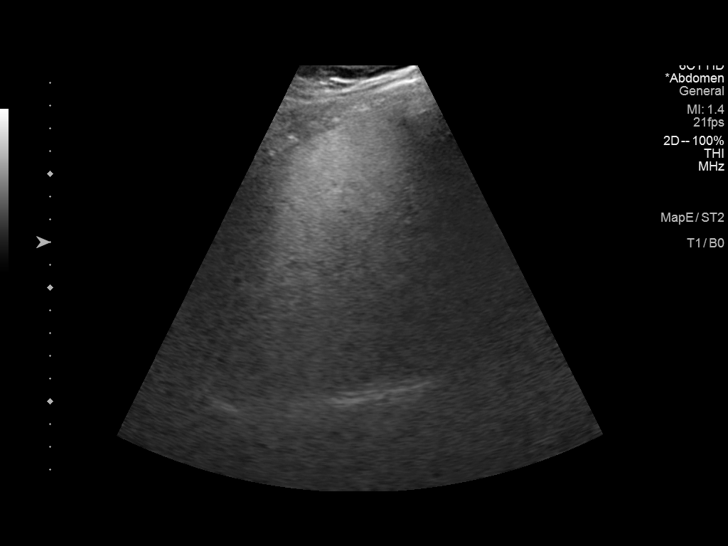
[im 42/42]
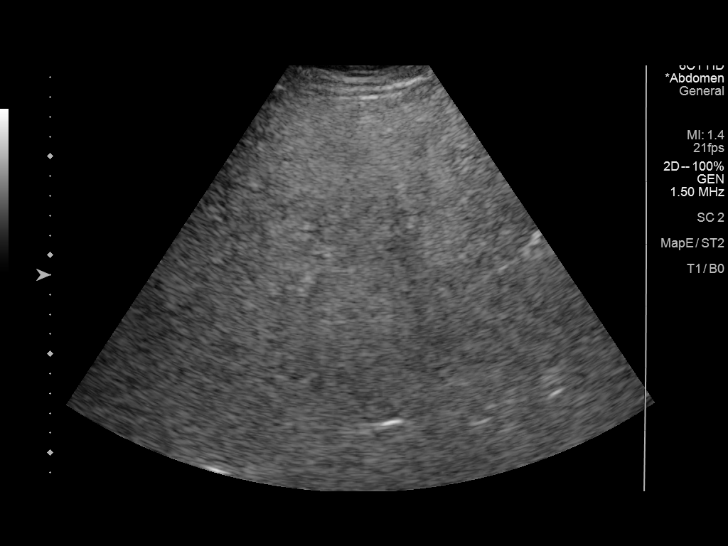

[14 of 25 positions shown; findings below may reference images not displayed]

FINDINGS: Gallbladder:

No significance slurred seen within the gallbladder lumen. Overlying
wall measures within normal limits at 2.8 mm. No free
pericholecystic fluid. No sonographic Murphy sign elicited on exam.

Common bile duct:

Diameter: 4.5 mm

Liver:

No focal lesion identified. Liver demonstrates a markedly dense and
echogenic echotexture, suggesting steatosis. Portal vein is patent
on color Doppler imaging with normal direction of blood flow towards
the liver.

Other: None.
IMPRESSION: 1. Markedly dense and echogenic echotexture of the liver, suggesting
steatosis.
2. Normal sonographic appearance of the gallbladder. No biliary
dilatation.

## 2022-10-11 ENCOUNTER — Other Ambulatory Visit: Payer: Self-pay | Admitting: Family Medicine

## 2022-10-11 DIAGNOSIS — I10 Essential (primary) hypertension: Secondary | ICD-10-CM

## 2022-10-11 DIAGNOSIS — G4709 Other insomnia: Secondary | ICD-10-CM

## 2022-10-11 DIAGNOSIS — E782 Mixed hyperlipidemia: Secondary | ICD-10-CM

## 2022-10-22 ENCOUNTER — Ambulatory Visit (INDEPENDENT_AMBULATORY_CARE_PROVIDER_SITE_OTHER): Payer: Commercial Managed Care - PPO | Admitting: Family Medicine

## 2022-10-22 VITALS — BP 136/84 | HR 102 | Temp 97.6°F | Ht 72.0 in | Wt 233.0 lb

## 2022-10-22 DIAGNOSIS — E782 Mixed hyperlipidemia: Secondary | ICD-10-CM

## 2022-10-22 DIAGNOSIS — F1090 Alcohol use, unspecified, uncomplicated: Secondary | ICD-10-CM

## 2022-10-22 DIAGNOSIS — D751 Secondary polycythemia: Secondary | ICD-10-CM

## 2022-10-22 DIAGNOSIS — Z789 Other specified health status: Secondary | ICD-10-CM

## 2022-10-22 DIAGNOSIS — E291 Testicular hypofunction: Secondary | ICD-10-CM | POA: Diagnosis not present

## 2022-10-22 DIAGNOSIS — E6609 Other obesity due to excess calories: Secondary | ICD-10-CM

## 2022-10-22 DIAGNOSIS — K7581 Nonalcoholic steatohepatitis (NASH): Secondary | ICD-10-CM

## 2022-10-22 DIAGNOSIS — I10 Essential (primary) hypertension: Secondary | ICD-10-CM

## 2022-10-22 DIAGNOSIS — Z6831 Body mass index (BMI) 31.0-31.9, adult: Secondary | ICD-10-CM

## 2022-10-22 DIAGNOSIS — G4709 Other insomnia: Secondary | ICD-10-CM

## 2022-10-22 NOTE — Progress Notes (Unsigned)
Subjective:  Patient ID: David Murphy, male    DOB: 1971/04/26  Age: 52 y.o. MRN: [id number]  Chief Complaint  Patient presents with   Hyperlipidemia   Hypertension    HPI: Hyperlipidemia: Patient is taking Zetia 10 mg daily, Krill Oil 500mg  one twice a day.   Hypertension: He takes Lisinopril 40 mg daily.   Testicular Hypofunction: Testosterone 1.62% gel apply 3 pumps topically once a day to clean, dry, intact skin. Out of it for a little while.    Insomnia: Zolpidem 10 mg daily at bedtime.  History of elevated LFTs. Hep panel has been negative. 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   [facility] Office Visit from 10/22/2022 in [facility]  AUDIT-C Score 5        Healthy diet. Moderate exercise.      10/22/2022    2:39 PM 10/16/2021    3:16 PM 06/05/2021    2:01 PM 05/08/2021    2:02 PM 12/16/2020   11:44 AM  Depression screen PHQ 2/9  Decreased Interest 0 0 0 0 0  Down, Depressed, Hopeless 0 0 0 0 0  PHQ - 2 Score 0 0 0 0 0     Review of Systems  Constitutional:  Negative for chills, diaphoresis, fatigue and fever.  HENT:  Negative for congestion, ear pain and sore throat.   Respiratory:  Negative for cough and shortness of breath.   Cardiovascular:  Negative for chest pain and leg swelling.  Gastrointestinal:  Negative for abdominal pain, constipation, diarrhea, nausea and vomiting.  Genitourinary:  Negative for dysuria and urgency.  Musculoskeletal:  Negative for arthralgias and myalgias.  Neurological:  Negative for dizziness and headaches.  Psychiatric/Behavioral:  Negative for dysphoric mood.     Current Outpatient Medications on File Prior to Visit  Medication Sig Dispense Refill   cyclobenzaprine (FLEXERIL) 10 MG tablet Take 1 tablet by mouth three times daily as needed for muscle spasm 90 tablet 0   ezetimibe (ZETIA) 10 MG tablet Take 1  tablet by mouth once daily 90 tablet 0   hydrocortisone (ANUSOL-HC) 25 MG suppository Place 1 suppository (25 mg total) rectally 2 (two) times daily. 12 suppository 2   icosapent Ethyl (VASCEPA) 1 g capsule Take 2 capsules (2 g total) by mouth 2 (two) times daily. 360 capsule 1   lisinopril (ZESTRIL) 40 MG tablet Take 1 tablet by mouth once daily 90 tablet 0   Omega-3 Fatty Acids (FISH OIL PO) Take 500 mg by mouth 2 (two) times daily.     Testosterone 1.62 % GEL APPLY 3 PUMPS TOPICALLY ONCE DAILY TO CLEAN, DRY, INTACT SKIN OF SHOULDERS AND UPPER ARMS 75 g 0   zolpidem (AMBIEN) 10 MG tablet Take 1 tablet (10 mg total) by mouth at bedtime as needed. 30 tablet 0   No current facility-administered medications on file prior to visit.   Past Medical History:  Diagnosis Date   Acute hepatitis B without delta-agent and without hepatic coma    Essential hypertension    Mixed hyperlipidemia    Primary insomnia    Testicular hypofunction    Past Surgical History:  Procedure Laterality Date   HERNIA REPAIR      Family History  Problem Relation Age of Onset   Hyperlipidemia Mother    Diabetes Mother    Hyperlipidemia Father    Social History   Socioeconomic History  Marital status: Divorced    Spouse name: Not on file   Number of children: 1   Years of education: Not on file   Highest education level: Not on file  Occupational History   Occupation: Delivery Driver    Comment: [employer]  Tobacco Use   Smoking status: Never   Smokeless tobacco: Never  Vaping Use   Vaping Use: Never used  Substance and Sexual Activity   Alcohol use: Yes    Alcohol/week: 35.0 standard drinks of alcohol    Types: 35 Cans of beer per week    Comment: Alcoholic, currently not in treatment, drink 4-5 cans of beer daily, some more   Drug use: Never   Sexual activity: Not Currently  Other Topics Concern   Not on file  Social History Narrative   Not on file   Social Determinants of Health    Financial Resource Strain: Low Risk  (10/22/2022)   Overall Financial Resource Strain (CARDIA)    Difficulty of Paying Living Expenses: Not hard at all  Food Insecurity: No Food Insecurity (10/22/2022)   Hunger Vital Sign    Worried About Running Out of Food in the Last Year: Never true    Ran Out of Food in the Last Year: Never true  Transportation Needs: No Transportation Needs (10/22/2022)   PRAPARE - Hydrologist (Medical): No    Lack of Transportation (Non-Medical): No  Physical Activity: Insufficiently Active (10/16/2021)   Exercise Vital Sign    Days of Exercise per Week: 2 days    Minutes of Exercise per Session: 20 min  Stress: No Stress Concern Present (10/22/2022)   Rew    Feeling of Stress : Not at all  Social Connections: Socially Isolated (10/22/2022)   Social Connection and Isolation Panel [NHANES]    Frequency of Communication with Friends and Family: More than three times a week    Frequency of Social Gatherings with Friends and Family: More than three times a week    Attends Religious Services: Never    Marine scientist or Organizations: No    Attends Music therapist: Never    Marital Status: Divorced    Objective:  BP 136/84   Pulse (!) 102   Temp 97.6 F (36.4 C)   Ht 6' (1.829 m)   Wt 233 lb (105.7 kg)   SpO2 98%   BMI 31.60 kg/m      10/22/2022    2:39 PM 01/22/2022    1:31 PM 11/22/2021    3:09 PM  BP/Weight  Systolic BP [id number]  Diastolic BP 84 84 96  Wt. (Lbs) 233 228 225.6  BMI 31.6 kg/m2 30.92 kg/m2 30.6 kg/m2    Physical Exam Vitals reviewed.  Constitutional:      Appearance: Normal appearance.  HENT:     Right Ear: Tympanic membrane normal.     Left Ear: Tympanic membrane normal.  Neck:     Vascular: No carotid bruit.  Cardiovascular:     Rate and Rhythm: Normal rate and regular rhythm.     Heart sounds:  Normal heart sounds.  Pulmonary:     Effort: Pulmonary effort is normal.     Breath sounds: Normal breath sounds. No wheezing, rhonchi or rales.  Abdominal:     General: Bowel sounds are normal.     Palpations: Abdomen is soft.     Tenderness: There is no abdominal  tenderness.  Neurological:     Mental Status: He is alert and oriented to person, place, and time.  Psychiatric:        Mood and Affect: Mood normal.        Behavior: Behavior normal.     Diabetic Foot Exam - Simple   No data filed      Lab Results  Component Value Date   WBC 7.1 10/22/2022   HGB 16.6 10/22/2022   HCT 49.9 10/22/2022   PLT 194 10/22/2022   GLUCOSE 84 10/22/2022   CHOL 255 (H) 10/22/2022   TRIG 224 (H) 10/22/2022   HDL 44 10/22/2022   LDLCALC 169 (H) 10/22/2022   ALT 367 (HH) 10/22/2022   AST 183 (H) 10/22/2022   NA 139 10/22/2022   K 5.3 (H) 10/22/2022   CL 99 10/22/2022   CREATININE 1.05 10/22/2022   BUN 12 10/22/2022   CO2 24 10/22/2022   TSH 2.860 10/16/2021      Assessment & Plan:    Essential hypertension Assessment & Plan: Well controlled.  No changes to medicines. Lisinopril 40 mg daily.  Continue to work on eating a healthy diet and exercise.  Labs drawn today.    Orders: -     CBC with Differential/Platelet -     Comprehensive metabolic panel  Testicular hypofunction Assessment & Plan: Testosterone 1.62% gel apply 3 pumps topically once a day to clean, dry, intact skin has not been taking lately. Check testosterone levels today.  Orders: -     Testosterone,Free and Total  Mixed hyperlipidemia Assessment & Plan: Await labs/testing for assessment and recommendations. Continue to work on eating a healthy diet and exercise.  Labs drawn today.    Orders: -     Lipid panel  Polycythemia, secondary Assessment & Plan: Check labs.   Other insomnia Assessment & Plan: The current medical regimen is effective;  continue present plan and medications. Zolpidem  10 mg at bedtime.   Nonalcoholic steatohepatitis (NASH) Assessment & Plan: Ordered referral to GI. Recommend continue to work on eating healthy diet and exercise.  Check labs.  Orders: -     Gamma GT -     Ambulatory referral to Gastroenterology  Alcohol use Assessment & Plan: Recommend stop drinking alcohol.  Recommend avoid tylenol.   Other hemochromatosis Assessment & Plan: Management per specialist. Not contributing to liver dysfunction.   Class 1 obesity due to excess calories with serious comorbidity and body mass index (BMI) of 31.0 to 31.9 in adult Assessment & Plan: Recommend continue to work on eating healthy diet and exercise. Recommend weight loss.   Other orders -     Cardiovascular Risk Assessment     No orders of the defined types were placed in this encounter.   Orders Placed This Encounter  Procedures   CBC with Differential/Platelet   Comprehensive metabolic panel   Lipid panel   Testosterone,Free and Total   Gamma GT   Cardiovascular Risk Assessment   Ambulatory referral to Gastroenterology     Follow-up: Return in about 3 months (around 01/22/2023) for chronic fasting.  I,Marla I Leal-Borjas,acting as a scribe for Rochel Brome, MD.,have documented all relevant documentation on the behalf of Rochel Brome, MD,as directed by  Rochel Brome, MD while in the presence of Rochel Brome, MD.    An After Visit Summary was printed and given to the patient.  I attest that I have reviewed this visit and agree with the plan scribed by my staff.  Rochel Brome, MD [facility] [phone]

## 2022-10-24 ENCOUNTER — Other Ambulatory Visit: Payer: Self-pay

## 2022-10-24 DIAGNOSIS — R748 Abnormal levels of other serum enzymes: Secondary | ICD-10-CM

## 2022-10-24 LAB — COMPREHENSIVE METABOLIC PANEL
ALT: 367 IU/L (ref 0–44)
AST: 183 IU/L — ABNORMAL HIGH (ref 0–40)
Albumin/Globulin Ratio: 2.1 (ref 1.2–2.2)
Albumin: 5 g/dL (ref 4.1–5.1)
Alkaline Phosphatase: 117 IU/L (ref 44–121)
BUN/Creatinine Ratio: 11 (ref 9–20)
BUN: 12 mg/dL (ref 6–24)
Bilirubin Total: 0.8 mg/dL (ref 0.0–1.2)
CO2: 24 mmol/L (ref 20–29)
Calcium: 10.4 mg/dL — ABNORMAL HIGH (ref 8.7–10.2)
Chloride: 99 mmol/L (ref 96–106)
Creatinine, Ser: 1.05 mg/dL (ref 0.76–1.27)
Globulin, Total: 2.4 g/dL (ref 1.5–4.5)
Glucose: 84 mg/dL (ref 70–99)
Potassium: 5.3 mmol/L — ABNORMAL HIGH (ref 3.5–5.2)
Sodium: 139 mmol/L (ref 134–144)
Total Protein: 7.4 g/dL (ref 6.0–8.5)
eGFR: 86 mL/min/{1.73_m2} (ref >59)

## 2022-10-24 LAB — LIPID PANEL
Chol/HDL Ratio: 5.8 ratio — ABNORMAL HIGH (ref 0.0–5.0)
Cholesterol, Total: 255 mg/dL — ABNORMAL HIGH (ref 100–199)
HDL: 44 mg/dL (ref >39)
LDL Chol Calc (NIH): 169 mg/dL — ABNORMAL HIGH (ref 0–99)
Triglycerides: 224 mg/dL — ABNORMAL HIGH (ref 0–149)
VLDL Cholesterol Cal: 42 mg/dL — ABNORMAL HIGH (ref 5–40)

## 2022-10-24 LAB — CBC WITH DIFFERENTIAL/PLATELET
Basophils Absolute: 0.1 10*3/uL (ref 0.0–0.2)
Basos: 1 %
EOS (ABSOLUTE): 0.3 10*3/uL (ref 0.0–0.4)
Eos: 4 %
Hematocrit: 49.9 % (ref 37.5–51.0)
Hemoglobin: 16.6 g/dL (ref 13.0–17.7)
Immature Grans (Abs): 0.1 10*3/uL (ref 0.0–0.1)
Immature Granulocytes: 1 %
Lymphocytes Absolute: 1.5 10*3/uL (ref 0.7–3.1)
Lymphs: 21 %
MCH: 30 pg (ref 26.6–33.0)
MCHC: 33.3 g/dL (ref 31.5–35.7)
MCV: 90 fL (ref 79–97)
Monocytes Absolute: 0.7 10*3/uL (ref 0.1–0.9)
Monocytes: 11 %
Neutrophils Absolute: 4.4 10*3/uL (ref 1.4–7.0)
Neutrophils: 62 %
Platelets: 194 10*3/uL (ref 150–450)
RBC: 5.54 x10E6/uL (ref 4.14–5.80)
RDW: 11.9 % (ref 11.6–15.4)
WBC: 7.1 10*3/uL (ref 3.4–10.8)

## 2022-10-24 LAB — GAMMA GT: GGT: 274 IU/L — ABNORMAL HIGH (ref 0–65)

## 2022-10-24 LAB — CARDIOVASCULAR RISK ASSESSMENT

## 2022-10-24 LAB — TESTOSTERONE,FREE AND TOTAL
Testosterone, Free: 6 pg/mL — ABNORMAL LOW (ref 7.2–24.0)
Testosterone: 204 ng/dL — ABNORMAL LOW (ref 264–916)

## 2022-10-27 DIAGNOSIS — Z789 Other specified health status: Secondary | ICD-10-CM | POA: Insufficient documentation

## 2022-10-27 NOTE — Assessment & Plan Note (Addendum)
Management per specialist. 

## 2022-10-27 NOTE — Assessment & Plan Note (Signed)
Check labs 

## 2022-10-27 NOTE — Assessment & Plan Note (Addendum)
Well controlled.  No changes to medicines. Lisinopril 40 mg daily. Continue to work on eating a healthy diet and exercise.  Labs drawn today.   

## 2022-10-27 NOTE — Assessment & Plan Note (Signed)
The current medical regimen is effective;  continue present plan and medications. Zolpidem 10 mg at bedtime.

## 2022-10-27 NOTE — Assessment & Plan Note (Addendum)
Ordered referral to GI. Recommend continue to work on eating healthy diet and exercise.  Check labs.

## 2022-10-27 NOTE — Assessment & Plan Note (Signed)
Testosterone 1.62% gel apply 3 pumps topically once a day to clean, dry, intact skin has not been taking lately. Check testosterone levels today.

## 2022-10-28 ENCOUNTER — Encounter: Payer: Self-pay | Admitting: Family Medicine

## 2022-10-28 DIAGNOSIS — E6609 Other obesity due to excess calories: Secondary | ICD-10-CM | POA: Insufficient documentation

## 2022-10-28 NOTE — Assessment & Plan Note (Signed)
Recommend continue to work on eating healthy diet and exercise. ?Recommend weight loss.  ?

## 2022-10-28 NOTE — Assessment & Plan Note (Signed)
Recommend stop drinking alcohol.  Recommend avoid tylenol.

## 2022-10-28 NOTE — Assessment & Plan Note (Signed)
Await labs/testing for assessment and recommendations. Continue to work on eating a healthy diet and exercise.  Labs drawn today.   

## 2022-10-29 ENCOUNTER — Ambulatory Visit: Payer: Commercial Managed Care - PPO

## 2022-10-29 DIAGNOSIS — R748 Abnormal levels of other serum enzymes: Secondary | ICD-10-CM

## 2022-10-29 NOTE — Progress Notes (Signed)
Patient's results were called to him. He needed to come back for labs. Scheduled to return on 3/18/024. Blood count normal.  Liver function very abnormal. Stop alcohol. Stop tylenol.  GGT high.  Kidney function normal.  Cholesterol: trigs worsened. LDL worsened. Not at goal. Continue vascepa 1 gm 2 capsules twice daily and zetia 10 mg daily.  Testosterone levels low. I do not recommend restarting until liver improves and lipids improve.

## 2022-10-30 LAB — COMPREHENSIVE METABOLIC PANEL
ALT: 206 IU/L (ref 0–44)
AST: 91 IU/L — ABNORMAL HIGH (ref 0–40)
Albumin/Globulin Ratio: 2 (ref 1.2–2.2)
Albumin: 4.9 g/dL (ref 4.1–5.1)
Alkaline Phosphatase: 103 IU/L (ref 44–121)
BUN/Creatinine Ratio: 11 (ref 9–20)
BUN: 12 mg/dL (ref 6–24)
Bilirubin Total: 0.4 mg/dL (ref 0.0–1.2)
CO2: 27 mmol/L (ref 20–29)
Calcium: 10.2 mg/dL (ref 8.7–10.2)
Chloride: 101 mmol/L (ref 96–106)
Creatinine, Ser: 1.11 mg/dL (ref 0.76–1.27)
Globulin, Total: 2.5 g/dL (ref 1.5–4.5)
Glucose: 80 mg/dL (ref 70–99)
Potassium: 5 mmol/L (ref 3.5–5.2)
Sodium: 142 mmol/L (ref 134–144)
Total Protein: 7.4 g/dL (ref 6.0–8.5)
eGFR: 81 mL/min/{1.73_m2} (ref >59)

## 2022-10-31 ENCOUNTER — Other Ambulatory Visit: Payer: Self-pay

## 2022-10-31 DIAGNOSIS — R748 Abnormal levels of other serum enzymes: Secondary | ICD-10-CM

## 2022-11-12 ENCOUNTER — Other Ambulatory Visit: Payer: Self-pay | Admitting: Family Medicine

## 2022-11-12 DIAGNOSIS — G4709 Other insomnia: Secondary | ICD-10-CM

## 2022-12-03 ENCOUNTER — Other Ambulatory Visit: Payer: Commercial Managed Care - PPO

## 2022-12-03 DIAGNOSIS — R748 Abnormal levels of other serum enzymes: Secondary | ICD-10-CM

## 2022-12-04 LAB — COMPREHENSIVE METABOLIC PANEL
ALT: 171 IU/L — ABNORMAL HIGH (ref 0–44)
AST: 67 IU/L — ABNORMAL HIGH (ref 0–40)
Albumin/Globulin Ratio: 2 (ref 1.2–2.2)
Albumin: 4.7 g/dL (ref 4.1–5.1)
Alkaline Phosphatase: 104 IU/L (ref 44–121)
BUN/Creatinine Ratio: 9 (ref 9–20)
BUN: 9 mg/dL (ref 6–24)
Bilirubin Total: 0.4 mg/dL (ref 0.0–1.2)
CO2: 22 mmol/L (ref 20–29)
Calcium: 9.5 mg/dL (ref 8.7–10.2)
Chloride: 97 mmol/L (ref 96–106)
Creatinine, Ser: 1.02 mg/dL (ref 0.76–1.27)
Globulin, Total: 2.3 g/dL (ref 1.5–4.5)
Glucose: 84 mg/dL (ref 70–99)
Potassium: 4.5 mmol/L (ref 3.5–5.2)
Sodium: 138 mmol/L (ref 134–144)
Total Protein: 7 g/dL (ref 6.0–8.5)
eGFR: 90 mL/min/{1.73_m2} (ref >59)

## 2022-12-12 ENCOUNTER — Other Ambulatory Visit: Payer: Self-pay | Admitting: Family Medicine

## 2022-12-12 DIAGNOSIS — E291 Testicular hypofunction: Secondary | ICD-10-CM

## 2023-01-06 ENCOUNTER — Other Ambulatory Visit: Payer: Self-pay | Admitting: Family Medicine

## 2023-01-06 DIAGNOSIS — I10 Essential (primary) hypertension: Secondary | ICD-10-CM

## 2023-01-06 DIAGNOSIS — E782 Mixed hyperlipidemia: Secondary | ICD-10-CM

## 2023-01-08 ENCOUNTER — Other Ambulatory Visit: Payer: Self-pay

## 2023-01-08 DIAGNOSIS — E782 Mixed hyperlipidemia: Secondary | ICD-10-CM

## 2023-01-08 DIAGNOSIS — I10 Essential (primary) hypertension: Secondary | ICD-10-CM

## 2023-01-08 MED ORDER — EZETIMIBE 10 MG PO TABS
10.0000 mg | ORAL_TABLET | Freq: Every day | ORAL | 0 refills | Status: DC
Start: 2023-01-08 — End: 2023-06-30

## 2023-01-08 MED ORDER — LISINOPRIL 40 MG PO TABS
40.0000 mg | ORAL_TABLET | Freq: Every day | ORAL | 0 refills | Status: DC
Start: 2023-01-08 — End: 2023-04-16

## 2023-02-04 ENCOUNTER — Ambulatory Visit: Payer: Commercial Managed Care - PPO | Admitting: Family Medicine

## 2023-03-12 ENCOUNTER — Other Ambulatory Visit: Payer: Self-pay | Admitting: Family Medicine

## 2023-03-12 DIAGNOSIS — E291 Testicular hypofunction: Secondary | ICD-10-CM

## 2023-03-13 ENCOUNTER — Telehealth: Payer: Self-pay

## 2023-03-13 NOTE — Telephone Encounter (Signed)
PA for testosterone gel has been submitted through [employer] website Reference Number:[id number]

## 2023-03-21 NOTE — Telephone Encounter (Signed)
PA for Testosterone injection was approved.

## 2023-04-16 ENCOUNTER — Ambulatory Visit (INDEPENDENT_AMBULATORY_CARE_PROVIDER_SITE_OTHER): Payer: Commercial Managed Care - PPO | Admitting: Family Medicine

## 2023-04-16 VITALS — BP 144/90 | HR 78 | Temp 97.4°F | Resp 18 | Ht 72.0 in | Wt 242.0 lb

## 2023-04-16 DIAGNOSIS — K7581 Nonalcoholic steatohepatitis (NASH): Secondary | ICD-10-CM

## 2023-04-16 DIAGNOSIS — H6122 Impacted cerumen, left ear: Secondary | ICD-10-CM | POA: Insufficient documentation

## 2023-04-16 DIAGNOSIS — E782 Mixed hyperlipidemia: Secondary | ICD-10-CM | POA: Diagnosis not present

## 2023-04-16 DIAGNOSIS — I10 Essential (primary) hypertension: Secondary | ICD-10-CM | POA: Diagnosis not present

## 2023-04-16 DIAGNOSIS — Z125 Encounter for screening for malignant neoplasm of prostate: Secondary | ICD-10-CM

## 2023-04-16 DIAGNOSIS — R748 Abnormal levels of other serum enzymes: Secondary | ICD-10-CM | POA: Diagnosis not present

## 2023-04-16 DIAGNOSIS — E291 Testicular hypofunction: Secondary | ICD-10-CM

## 2023-04-16 DIAGNOSIS — D751 Secondary polycythemia: Secondary | ICD-10-CM

## 2023-04-16 MED ORDER — OLMESARTAN MEDOXOMIL 20 MG PO TABS
20.0000 mg | ORAL_TABLET | Freq: Every day | ORAL | 0 refills | Status: DC
Start: 2023-04-16 — End: 2023-06-26

## 2023-04-16 MED ORDER — CAPSAICIN 0.1 % EX CREA: 1.0000 | TOPICAL_CREAM | Freq: Two times a day (BID) | CUTANEOUS | 3 refills | Status: DC

## 2023-04-16 MED ORDER — SILDENAFIL CITRATE 50 MG PO TABS
50.0000 mg | ORAL_TABLET | Freq: Every day | ORAL | 0 refills | Status: DC | PRN
Start: 2023-04-16 — End: 2023-07-29

## 2023-04-16 MED ORDER — PREDNISONE 50 MG PO TABS
50.0000 mg | ORAL_TABLET | Freq: Every day | ORAL | 0 refills | Status: DC
Start: 2023-04-16 — End: 2023-07-29

## 2023-04-16 NOTE — Patient Instructions (Signed)
Hypertension: Poorly controlled.   Check blood pressure daily.   Stop lisinopril.  Start olmesartan 20 mg daily.   Earaches: Start prednisone 50 mg daily for 5 days.  Sent Viagra.

## 2023-04-16 NOTE — Progress Notes (Unsigned)
Subjective:  Patient ID: David Murphy, male    DOB: 1971/01/09  Age: 52 y.o. MRN: [id number]  Chief Complaint  Patient presents with   Medical Management of Chronic Issues    HPI  Hyperlipidemia: Patient is taking Zetia 10 mg daily, Krill Oil 500mg  one twice a day.   Hypertension: He takes Lisinopril 40 mg daily.   Testicular Hypofunction: Testosterone 1.62% gel apply 3 pumps topically once a day to clean, dry, intact skin. Does not seem if it is helping.     Insomnia: Zolpidem 10 mg daily at bedtime. Not working as well. Failed on melatonin. Requesting to increase dose of ambien.   History of elevated LFTs. Hep panel has been negative. 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        04/16/2023    2:49 PM 10/22/2022    2:39 PM 10/16/2021    3:16 PM 06/05/2021    2:01 PM 05/08/2021    2:02 PM  Depression screen PHQ 2/9  Decreased Interest 0 0 0 0 0  Down, Depressed, Hopeless 0 0 0 0 0  PHQ - 2 Score 0 0 0 0 0  Altered sleeping 0      Tired, decreased energy 0      Change in appetite 0      Feeling bad or failure about yourself  0      Trouble concentrating 0      Moving slowly or fidgety/restless 0      Suicidal thoughts 0      PHQ-9 Score 0      Difficult doing work/chores Not difficult at all            04/16/2023    2:49 PM  Fall Risk   Falls in the past year? 0  Number falls in past yr: 0  Injury with Fall? 0  Risk for fall due to : No Fall Risks  Follow up Falls evaluation completed;Follow up appointment    Patient Care Team: Blane Ohara, MD as PCP - General (Family Medicine) Misenheimer, Marcial Pacas, MD as Consulting Physician (Unknown Physician Specialty)   Review of Systems  Constitutional:  Negative for chills and fever.  HENT:  Positive for ear pain (left earache). Negative for congestion, rhinorrhea and sore throat.   Respiratory:  Negative for cough and  shortness of breath.   Cardiovascular:  Negative for chest pain and palpitations.  Gastrointestinal:  Negative for abdominal pain, constipation, diarrhea, nausea and vomiting.  Genitourinary:  Negative for dysuria and urgency.  Musculoskeletal:  Negative for arthralgias (left shoulder pain), back pain and myalgias.  Neurological:  Negative for dizziness and headaches.  Psychiatric/Behavioral:  Negative for dysphoric mood. The patient is not nervous/anxious.     Current Outpatient Medications on File Prior to Visit  Medication Sig Dispense Refill   cyclobenzaprine (FLEXERIL) 10 MG tablet Take 1 tablet by mouth three times daily as needed for muscle spasm 90 tablet 0   ezetimibe (ZETIA) 10 MG tablet Take 1 tablet (10 mg total) by mouth daily. 90 tablet 0   hydrocortisone (ANUSOL-HC) 25 MG suppository Place 1 suppository (25 mg total) rectally 2 (two) times daily. 12 suppository 2   Omega-3 Fatty Acids (FISH OIL PO) Take 500 mg by mouth 2 (two) times daily.     Testosterone 1.62 % GEL APPLY 3 PUMPS TOPICALLY ONCE DAILY TO CLEAN, DRY, INTACT SKIN OF SHOULDERS AND UPPER ARMS 75  g 0   zolpidem (AMBIEN) 10 MG tablet Take 1 tablet (10 mg total) by mouth at bedtime. 30 tablet 5   No current facility-administered medications on file prior to visit.   Past Medical History:  Diagnosis Date   Acute hepatitis B without delta-agent and without hepatic coma    Essential hypertension    Mixed hyperlipidemia    Primary insomnia    Testicular hypofunction    Past Surgical History:  Procedure Laterality Date   HERNIA REPAIR      Family History  Problem Relation Age of Onset   Hyperlipidemia Mother    Diabetes Mother    Hyperlipidemia Father    Social History   Socioeconomic History   Marital status: Divorced    Spouse name: Not on file   Number of children: 1   Years of education: Not on file   Highest education level: Not on file  Occupational History   Occupation: Delivery Driver     Comment: [employer]  Tobacco Use   Smoking status: Never   Smokeless tobacco: Never  Vaping Use   Vaping status: Never Used  Substance and Sexual Activity   Alcohol use: Yes    Alcohol/week: 35.0 standard drinks of alcohol    Types: 35 Cans of beer per week    Comment: Alcoholic, currently not in treatment, drink 4-5 cans of beer daily, some more   Drug use: Never   Sexual activity: Not Currently  Other Topics Concern   Not on file  Social History Narrative   Not on file   Social Determinants of Health   Financial Resource Strain: Low Risk  (10/22/2022)   Overall Financial Resource Strain (CARDIA)    Difficulty of Paying Living Expenses: Not hard at all  Food Insecurity: No Food Insecurity (10/22/2022)   Hunger Vital Sign    Worried About Running Out of Food in the Last Year: Never true    Ran Out of Food in the Last Year: Never true  Transportation Needs: No Transportation Needs (10/22/2022)   PRAPARE - Administrator, Civil Service (Medical): No    Lack of Transportation (Non-Medical): No  Physical Activity: Insufficiently Active (10/16/2021)   Exercise Vital Sign    Days of Exercise per Week: 2 days    Minutes of Exercise per Session: 20 min  Stress: No Stress Concern Present (10/22/2022)   [employer] of Occupational Health - Occupational Stress Questionnaire    Feeling of Stress : Not at all  Social Connections: Socially Isolated (10/22/2022)   Social Connection and Isolation Panel [NHANES]    Frequency of Communication with Friends and Family: More than three times a week    Frequency of Social Gatherings with Friends and Family: More than three times a week    Attends Religious Services: Never    Database administrator or Organizations: No    Attends Engineer, structural: Never    Marital Status: Divorced    Objective:  BP (!) 144/90   Pulse 78   Temp (!) 97.4 F (36.3 C)   Resp 18   Ht 6' (1.829 m)   Wt 242 lb (109.8 kg)   BMI 32.82  kg/m      04/16/2023    2:53 PM 04/16/2023    2:46 PM 10/22/2022    2:39 PM  BP/Weight  Systolic BP 144 130 136  Diastolic BP 90 90 84  Wt. (Lbs)  242 233  BMI  32.82 kg/m2 31.6  kg/m2    Physical Exam Constitutional:      Appearance: Normal appearance. He is obese.  HENT:     Right Ear: Tympanic membrane, ear canal and external ear normal.     Left Ear: Tympanic membrane, ear canal and external ear normal.     Nose: Nose normal. No congestion or rhinorrhea.     Mouth/Throat:     Mouth: Mucous membranes are moist.     Pharynx: No oropharyngeal exudate or posterior oropharyngeal erythema.  Neck:     Vascular: No carotid bruit.  Cardiovascular:     Rate and Rhythm: Normal rate and regular rhythm.     Heart sounds: Normal heart sounds.  Pulmonary:     Effort: Pulmonary effort is normal. No respiratory distress.     Breath sounds: Normal breath sounds. No wheezing, rhonchi or rales.  Abdominal:     General: Bowel sounds are normal.     Palpations: Abdomen is soft.     Tenderness: There is no abdominal tenderness.  Neurological:     Mental Status: He is alert and oriented to person, place, and time.  Psychiatric:        Mood and Affect: Mood normal.        Behavior: Behavior normal.     Diabetic Foot Exam - Simple   No data filed      Lab Results  Component Value Date   WBC 7.7 04/16/2023   HGB 15.7 04/16/2023   HCT 48.1 04/16/2023   PLT 197 04/16/2023   GLUCOSE 72 04/16/2023   CHOL 242 (H) 04/16/2023   TRIG 154 (H) 04/16/2023   HDL 50 04/16/2023   LDLCALC 164 (H) 04/16/2023   ALT 233 (HH) 04/16/2023   AST 112 (H) 04/16/2023   NA 140 04/16/2023   K 4.7 04/16/2023   CL 99 04/16/2023   CREATININE 0.97 04/16/2023   BUN 12 04/16/2023   CO2 23 04/16/2023   TSH 3.400 04/16/2023      Assessment & Plan:    Essential hypertension Assessment & Plan: Poorly controlled.   Check blood pressure daily.   Stop lisinopril.  Start olmesartan 20 mg daily.     Orders: -     CBC with Differential/Platelet -     Comprehensive metabolic panel -     TSH -     Olmesartan Medoxomil; Take 1 tablet (20 mg total) by mouth daily.  Dispense: 90 tablet; Refill: 0  Elevated liver enzymes Assessment & Plan: Check labs.  Orders: -     Comprehensive metabolic panel  Nonalcoholic steatohepatitis (NASH) Assessment & Plan: Recommend continue to work on eating healthy diet and exercise.     Mixed hyperlipidemia Assessment & Plan: Await labs/testing for assessment and recommendations. Continue to work on eating a healthy diet and exercise.  Labs drawn today.    Orders: -     Lipid panel -     TSH  Other hemochromatosis Assessment & Plan: Management per specialist.   Polycythemia, secondary Assessment & Plan: Check labs.  Orders: -     CBC with Differential/Platelet  Testicular hypofunction Assessment & Plan: Testosterone 1.62% gel apply 3 pumps topically once a day to clean, dry, intact skin has not been taking lately.   Orders: -     Sildenafil Citrate; Take 1 tablet (50 mg total) by mouth daily as needed for erectile dysfunction.  Dispense: 50 tablet; Refill: 0  Prostate cancer screening -     PSA  Hearing loss of  left ear due to cerumen impaction -     predniSONE; Take 1 tablet (50 mg total) by mouth daily with breakfast.  Dispense: 5 tablet; Refill: 0  Other orders -     Capsaicin; Apply 1 Application topically in the morning and at bedtime.  Dispense: 60 g; Refill: 3     Meds ordered this encounter  Medications   sildenafil (VIAGRA) 50 MG tablet    Sig: Take 1 tablet (50 mg total) by mouth daily as needed for erectile dysfunction.    Dispense:  50 tablet    Refill:  0   olmesartan (BENICAR) 20 MG tablet    Sig: Take 1 tablet (20 mg total) by mouth daily.    Dispense:  90 tablet    Refill:  0   Capsaicin (ZOSTRIX HP) 0.1 % CREA    Sig: Apply 1 Application topically in the morning and at bedtime.    Dispense:   60 g    Refill:  3   predniSONE (DELTASONE) 50 MG tablet    Sig: Take 1 tablet (50 mg total) by mouth daily with breakfast.    Dispense:  5 tablet    Refill:  0    Orders Placed This Encounter  Procedures   CBC with Differential/Platelet   Comprehensive metabolic panel   Lipid panel   TSH   PSA     Follow-up: Return in about 3 months (around 07/16/2023) for cpe fasting.   I,Carolyn M Morrison,acting as a Neurosurgeon for Blane Ohara, MD.,have documented all relevant documentation on the behalf of Blane Ohara, MD,as directed by  Blane Ohara, MD while in the presence of Blane Ohara, MD.   Clayborn Bigness I Leal-Borjas,acting as a scribe for Blane Ohara, MD.,have documented all relevant documentation on the behalf of Blane Ohara, MD,as directed by  Blane Ohara, MD while in the presence of Blane Ohara, MD.    An After Visit Summary was printed and given to the patient.  Blane Ohara, MD [facility] [phone]

## 2023-04-17 LAB — COMPREHENSIVE METABOLIC PANEL
ALT: 233 IU/L (ref 0–44)
AST: 112 IU/L — ABNORMAL HIGH (ref 0–40)
Albumin: 4.8 g/dL (ref 4.1–5.1)
Alkaline Phosphatase: 94 IU/L (ref 44–121)
BUN/Creatinine Ratio: 12 (ref 9–20)
BUN: 12 mg/dL (ref 6–24)
Bilirubin Total: 0.7 mg/dL (ref 0.0–1.2)
CO2: 23 mmol/L (ref 20–29)
Calcium: 10 mg/dL (ref 8.7–10.2)
Chloride: 99 mmol/L (ref 96–106)
Creatinine, Ser: 0.97 mg/dL (ref 0.76–1.27)
Globulin, Total: 2.5 g/dL (ref 1.5–4.5)
Glucose: 72 mg/dL (ref 70–99)
Potassium: 4.7 mmol/L (ref 3.5–5.2)
Sodium: 140 mmol/L (ref 134–144)
Total Protein: 7.3 g/dL (ref 6.0–8.5)
eGFR: 95 mL/min/{1.73_m2} (ref >59)

## 2023-04-17 LAB — CBC WITH DIFFERENTIAL/PLATELET
Basophils Absolute: 0.1 10*3/uL (ref 0.0–0.2)
Basos: 1 %
EOS (ABSOLUTE): 0.2 10*3/uL (ref 0.0–0.4)
Eos: 3 %
Hematocrit: 48.1 % (ref 37.5–51.0)
Hemoglobin: 15.7 g/dL (ref 13.0–17.7)
Immature Grans (Abs): 0.1 10*3/uL (ref 0.0–0.1)
Immature Granulocytes: 1 %
Lymphocytes Absolute: 1.7 10*3/uL (ref 0.7–3.1)
Lymphs: 22 %
MCH: 31.3 pg (ref 26.6–33.0)
MCHC: 32.6 g/dL (ref 31.5–35.7)
MCV: 96 fL (ref 79–97)
Monocytes Absolute: 0.8 10*3/uL (ref 0.1–0.9)
Monocytes: 10 %
Neutrophils Absolute: 4.9 10*3/uL (ref 1.4–7.0)
Neutrophils: 63 %
Platelets: 197 10*3/uL (ref 150–450)
RBC: 5.01 x10E6/uL (ref 4.14–5.80)
RDW: 13.4 % (ref 11.6–15.4)
WBC: 7.7 10*3/uL (ref 3.4–10.8)

## 2023-04-17 LAB — LIPID PANEL
Chol/HDL Ratio: 4.8 ratio (ref 0.0–5.0)
Cholesterol, Total: 242 mg/dL — ABNORMAL HIGH (ref 100–199)
HDL: 50 mg/dL (ref >39)
LDL Chol Calc (NIH): 164 mg/dL — ABNORMAL HIGH (ref 0–99)
Triglycerides: 154 mg/dL — ABNORMAL HIGH (ref 0–149)
VLDL Cholesterol Cal: 28 mg/dL (ref 5–40)

## 2023-04-17 LAB — PSA: Prostate Specific Ag, Serum: 1.8 ng/mL (ref 0.0–4.0)

## 2023-04-17 LAB — TSH: TSH: 3.4 u[IU]/mL (ref 0.450–4.500)

## 2023-04-18 ENCOUNTER — Other Ambulatory Visit: Payer: Self-pay | Admitting: Family Medicine

## 2023-04-18 DIAGNOSIS — E782 Mixed hyperlipidemia: Secondary | ICD-10-CM

## 2023-04-20 NOTE — Assessment & Plan Note (Signed)
Await labs/testing for assessment and recommendations. Continue to work on eating a healthy diet and exercise.  Labs drawn today.   

## 2023-04-20 NOTE — Assessment & Plan Note (Signed)
Check labs 

## 2023-04-20 NOTE — Assessment & Plan Note (Signed)
Testosterone 1.62% gel apply 3 pumps topically once a day to clean, dry, intact skin has not been taking lately.

## 2023-04-20 NOTE — Assessment & Plan Note (Signed)
Recommend continue to work on eating healthy diet and exercise.  

## 2023-04-20 NOTE — Assessment & Plan Note (Signed)
Management per specialist. 

## 2023-04-20 NOTE — Assessment & Plan Note (Signed)
Poorly controlled.   Check blood pressure daily.   Stop lisinopril.  Start olmesartan 20 mg daily.

## 2023-04-21 ENCOUNTER — Encounter: Payer: Self-pay | Admitting: Family Medicine

## 2023-04-29 ENCOUNTER — Other Ambulatory Visit: Payer: Self-pay | Admitting: Family Medicine

## 2023-05-07 ENCOUNTER — Other Ambulatory Visit: Payer: Self-pay | Admitting: Family Medicine

## 2023-05-07 DIAGNOSIS — G4709 Other insomnia: Secondary | ICD-10-CM

## 2023-05-13 ENCOUNTER — Ambulatory Visit: Payer: Commercial Managed Care - PPO

## 2023-05-17 ENCOUNTER — Other Ambulatory Visit: Payer: Self-pay | Admitting: Family Medicine

## 2023-05-17 DIAGNOSIS — Z1212 Encounter for screening for malignant neoplasm of rectum: Secondary | ICD-10-CM

## 2023-05-17 DIAGNOSIS — Z1211 Encounter for screening for malignant neoplasm of colon: Secondary | ICD-10-CM

## 2023-06-02 ENCOUNTER — Other Ambulatory Visit: Payer: Self-pay

## 2023-06-02 DIAGNOSIS — G4709 Other insomnia: Secondary | ICD-10-CM

## 2023-06-24 ENCOUNTER — Other Ambulatory Visit: Payer: Self-pay

## 2023-06-26 ENCOUNTER — Encounter: Payer: Self-pay | Admitting: Cardiovascular Disease

## 2023-06-26 ENCOUNTER — Ambulatory Visit: Payer: Commercial Managed Care - PPO | Attending: Cardiovascular Disease | Admitting: Cardiovascular Disease

## 2023-06-26 VITALS — BP 150/110 | HR 94 | Ht 72.0 in | Wt 243.8 lb

## 2023-06-26 DIAGNOSIS — D751 Secondary polycythemia: Secondary | ICD-10-CM

## 2023-06-26 DIAGNOSIS — K7581 Nonalcoholic steatohepatitis (NASH): Secondary | ICD-10-CM

## 2023-06-26 DIAGNOSIS — R748 Abnormal levels of other serum enzymes: Secondary | ICD-10-CM | POA: Diagnosis not present

## 2023-06-26 DIAGNOSIS — E782 Mixed hyperlipidemia: Secondary | ICD-10-CM

## 2023-06-26 DIAGNOSIS — Z789 Other specified health status: Secondary | ICD-10-CM

## 2023-06-26 DIAGNOSIS — I10 Essential (primary) hypertension: Secondary | ICD-10-CM

## 2023-06-26 DIAGNOSIS — G4733 Obstructive sleep apnea (adult) (pediatric): Secondary | ICD-10-CM

## 2023-06-26 MED ORDER — OLMESARTAN MEDOXOMIL 40 MG PO TABS: 40.0000 mg | ORAL_TABLET | Freq: Every day | ORAL | 3 refills | Status: DC

## 2023-06-26 NOTE — Patient Instructions (Addendum)
Medication Instructions:  Increase the Olmesartan from 20mg  to 40mg  daily.  Monitor your blood pressure.   Take blood pressure 2 hours after medication.   *If you need a refill on your cardiac medications before your next appointment, please call your pharmacy*   Lab Work: CBC, CMET, LIPID, LPa If you have labs (blood work) drawn today and your tests are completely normal, you will receive your results only by: MyChart Message (if you have MyChart) OR A paper copy in the mail If you have any lab test that is abnormal or we need to change your treatment, we will call you to review the results.   Testing/Procedures: Your physician has requested that you have an echocardiogram. Echocardiography is a painless test that uses sound waves to create images of your heart. It provides your doctor with information about the size and shape of your heart and how well your heart's chambers and valves are working. This procedure takes approximately one hour. There are no restrictions for this procedure. Please do NOT wear cologne, perfume, aftershave, or lotions (deodorant is allowed). Please arrive 15 minutes prior to your appointment time.  Please note: We ask at that you not bring children with you during ultrasound (echo/ vascular) testing. Due to room size and safety concerns, children are not allowed in the ultrasound rooms during exams. Our front office staff cannot provide observation of children in our lobby area while testing is being conducted. An adult accompanying a patient to their appointment will only be allowed in the ultrasound room at the discretion of the ultrasound technician under special circumstances. We apologize for any inconvenience.   -Calcium Score   Your physician has recommended that you have a sleep study. This test records several body functions during sleep, including: brain activity, eye movement, oxygen and carbon dioxide blood levels, heart rate and rhythm,  breathing rate and rhythm, the flow of air through your mouth and nose, snoring, body muscle movements, and chest and belly movement.    Please note: We ask at that you not bring children with you during ultrasound (echo/ vascular) testing. Due to room size and safety concerns, children are not allowed in the ultrasound rooms during exams. Our front office staff cannot provide observation of children in our lobby area while testing is being conducted. An adult accompanying a patient to their appointment will only be allowed in the ultrasound room at the discretion of the ultrasound technician under special circumstances. We apologize for any inconvenience.    Your physician has requested that you have an abdominal aorta duplex. During this test, an ultrasound is used to evaluate the aorta. Allow 30 minutes for this exam. Do not eat after midnight the day before and avoid carbonated beverages.  Please note: We ask at that you not bring children with you during ultrasound (echo/ vascular) testing. Due to room size and safety concerns, children are not allowed in the ultrasound rooms during exams. Our front office staff cannot provide observation of children in our lobby area while testing is being conducted. An adult accompanying a patient to their appointment will only be allowed in the ultrasound room at the discretion of the ultrasound technician under special circumstances. We apologize for any inconvenience.    Follow-Up: At [facility], you and your health needs are our priority.  As part of our continuing mission to provide you with exceptional heart care, we have created designated Provider Care Teams.  These Care Teams include your primary Cardiologist (physician)  and Advanced Practice Providers (APPs -  Physician Assistants and Nurse Practitioners) who all work together to provide you with the care you need, when you need it.  We recommend signing up for the patient portal called  "MyChart".  Sign up information is provided on this After Visit Summary.  MyChart is used to connect with patients for Virtual Visits (Telemedicine).  Patients are able to view lab/test results, encounter notes, upcoming appointments, etc.  Non-urgent messages can be sent to your provider as well.   To learn more about what you can do with MyChart, go to [email or web address].    Your next appointment:   6 week(s)  Provider:   Dr. Nicki Guadalajara

## 2023-06-26 NOTE — Progress Notes (Signed)
Cardiology Office Note    Date:  07/06/2023   ID:  David Murphy, DOB 28-May-1971, MRN [id number]  PCP:  Blane Ohara, MD  Cardiologist:  Nicki Guadalajara, MD   New cardoiolopgy consult referred by Blane Ohara, MD at [facility]   History of Present Illness:  David Murphy is a 52 y.o. male who is followed by Dr. Blane Ohara.  He has a history of hypertension, hyperlipidemia, insomnia, elevated LFTs, and testicular hypofunction.  He has a history of hypertension for 7 years and had been on lisinopril and most recently olmesartan 20 mg.  He is on Zetia 10 mg and over-the-counter fish oil for hyperlipidemia.  He uses  generic Viagra for erectile dysfunction.  His sleep has been poor and is up and down throughout the night and sleeps approximately 5 to 6 hours per night.  He admits to frequent nocturia and snoring.  Typically he may go to bed around 7 PM, and wakes up between 1 and 2 AM.  He works from 3 AM until noon.  He has been taking zolpidem 10 mg prior to bedtime.  When last seen by Dr. Sedalia Muta on April 16, 2023 his blood pressure was poorly controlled, lisinopril was discontinued and he was started on olmesartan 20 mg.  He has had at least a 3-year history of elevated liver function tests.  He admits to drinking daily at least a sixpack or more a day.  Laboratory in April 2023 showed an ALT at 299 and AST at 117.  LFTs have been elevated since 2021.  Remotely, he had been on Lipitor but this was discontinued with his elevation of liver function studies.  Most recent lipid studies were significantly increased on April 16, 2023 with total cholesterol 242, and LDL cholesterol 164 with triglycerides 154.  He is now referred for cardiology evaluation.   Past Medical History:  Diagnosis Date   Acute hepatitis B without delta-agent and without hepatic coma    Essential hypertension    Mixed hyperlipidemia    Primary insomnia    Testicular hypofunction     Past Surgical  History:  Procedure Laterality Date   HERNIA REPAIR      Current Medications: Outpatient Medications Prior to Visit  Medication Sig Dispense Refill   Capsaicin (ZOSTRIX HP) 0.1 % CREA Apply 1 Application topically in the morning and at bedtime. 60 g 3   cyclobenzaprine (FLEXERIL) 10 MG tablet Take 1 tablet by mouth three times daily as needed for muscle spasm 90 tablet 2   Omega-3 Fatty Acids (FISH OIL PO) Take 500 mg by mouth 2 (two) times daily.     Testosterone 1.62 % GEL APPLY 3 PUMPS TOPICALLY ONCE DAILY TO CLEAN, DRY, INTACT SKIN OF SHOULDERS AND UPPER ARMS 75 g 0   zolpidem (AMBIEN) 10 MG tablet TAKE 1 TABLET BY MOUTH AT BEDTIME 30 tablet 0   ezetimibe (ZETIA) 10 MG tablet Take 1 tablet (10 mg total) by mouth daily. 90 tablet 0   olmesartan (BENICAR) 20 MG tablet Take 1 tablet (20 mg total) by mouth daily. 90 tablet 0   hydrocortisone (ANUSOL-HC) 25 MG suppository Place 1 suppository (25 mg total) rectally 2 (two) times daily. (Patient not taking: Reported on 06/26/2023) 12 suppository 2   predniSONE (DELTASONE) 50 MG tablet Take 1 tablet (50 mg total) by mouth daily with breakfast. (Patient not taking: Reported on 06/26/2023) 5 tablet 0   sildenafil (VIAGRA) 50 MG tablet Take  1 tablet (50 mg total) by mouth daily as needed for erectile dysfunction. (Patient not taking: Reported on 06/26/2023) 50 tablet 0   No facility-administered medications prior to visit.     Allergies:   Patient has no known allergies.   Social History   Socioeconomic History   Marital status: Divorced    Spouse name: Not on file   Number of children: 1   Years of education: Not on file   Highest education level: Not on file  Occupational History   Occupation: Delivery Driver    Comment: [employer]  Tobacco Use   Smoking status: Never   Smokeless tobacco: Never  Vaping Use   Vaping status: Never Used  Substance and Sexual Activity   Alcohol use: Yes    Alcohol/week: 35.0 standard drinks of alcohol     Types: 35 Cans of beer per week    Comment: Alcoholic, currently not in treatment, drink 4-5 cans of beer daily, some more   Drug use: Never   Sexual activity: Not Currently  Other Topics Concern   Not on file  Social History Narrative   Not on file   Social Determinants of Health   Financial Resource Strain: Low Risk  (10/22/2022)   Overall Financial Resource Strain (CARDIA)    Difficulty of Paying Living Expenses: Not hard at all  Food Insecurity: No Food Insecurity (10/22/2022)   Hunger Vital Sign    Worried About Running Out of Food in the Last Year: Never true    Ran Out of Food in the Last Year: Never true  Transportation Needs: No Transportation Needs (10/22/2022)   PRAPARE - Administrator, Civil Service (Medical): No    Lack of Transportation (Non-Medical): No  Physical Activity: Insufficiently Active (10/16/2021)   Exercise Vital Sign    Days of Exercise per Week: 2 days    Minutes of Exercise per Session: 20 min  Stress: No Stress Concern Present (10/22/2022)   [employer] of Occupational Health - Occupational Stress Questionnaire    Feeling of Stress : Not at all  Social Connections: Socially Isolated (10/22/2022)   Social Connection and Isolation Panel [NHANES]    Frequency of Communication with Friends and Family: More than three times a week    Frequency of Social Gatherings with Friends and Family: More than three times a week    Attends Religious Services: Never    Database administrator or Organizations: No    Attends Banker Meetings: Never    Marital Status: Divorced    Socially he is divorced.  There is no tobacco use but he drinks at least a sixpack daily.  Family History:  The patient's family history includes Diabetes in his mother; Hyperlipidemia in his father and mother.   ROS General: Negative; No fevers, chills, or night sweats;  HEENT: Negative; No changes in vision or hearing, sinus congestion, difficulty  swallowing Pulmonary: Negative; No cough, wheezing, shortness of breath, hemoptysis Cardiovascular: Negative; No chest pain, presyncope, syncope, palpitations GI: Negative; No nausea, vomiting, diarrhea, or abdominal pain GU: Elevated LFTs since 2021. Musculoskeletal: Negative; no myalgias, joint pain, or weakness Hematologic/Oncology: Negative; no easy bruising, bleeding Endocrine: Negative; no heat/cold intolerance; no diabetes Neuro: Negative; no changes in balance, headaches Skin: Negative; No rashes or skin lesions Psychiatric: Negative; No behavioral problems, depression Sleep: Poor sleep hygiene with snoring, frequent nocturia, and frequent awakenings; no bruxism, restless legs, hypnogognic hallucinations, no cataplexy Other comprehensive 14 point system review is negative.  PHYSICAL EXAM:   VS:  BP (!) 150/110 (BP Location: Left Arm, Patient Position: Sitting, Cuff Size: Large)   Pulse 94   Ht 6' (1.829 m)   Wt 243 lb 12.8 oz (110.6 kg)   SpO2 97%   BMI 33.07 kg/m     Repeat blood pressure by me was 140/90  Wt Readings from Last 3 Encounters:  06/26/23 243 lb 12.8 oz (110.6 kg)  04/16/23 242 lb (109.8 kg)  10/22/22 233 lb (105.7 kg)    General: Alert, oriented, no distress.  Skin: normal turgor, no rashes, warm and dry HEENT: Normocephalic, atraumatic. Pupils equal round and reactive to light; sclera anicteric; extraocular muscles intact;  Nose without nasal septal hypertrophy Mouth/Parynx benign; Mallinpatti scale 3 Neck: No JVD, no carotid bruits; normal carotid upstroke Lungs: clear to ausculatation and percussion; no wheezing or rales Chest wall: without tenderness to palpitation Heart: PMI not displaced, RRR, s1 s2 normal, 1/6 systolic murmur, no diastolic murmur, no rubs, gallops, thrills, or heaves Abdomen: soft, nontender; no hepatosplenomehaly, BS+; abdominal aorta nontender and not dilated by palpation. Back: no CVA tenderness Pulses 2+ Musculoskeletal:  full range of motion, normal strength, no joint deformities Extremities: no clubbing cyanosis or edema, Homan's sign negative  Neurologic: grossly nonfocal; Cranial nerves grossly wnl Psychologic: Normal mood and affect   Studies/Labs Reviewed:   EKG Interpretation Date/Time:  Wednesday June 26 2023 08:43:40 EST Ventricular Rate:  94 PR Interval:  150 QRS Duration:  94 QT Interval:  346 QTC Calculation: 432 R Axis:   38  Text Interpretation: Normal sinus rhythm Cannot rule out Anterior infarct , age undetermined No previous ECGs available Confirmed by Nicki Guadalajara ([id number]) on 06/26/2023 9:07:19 AM    Recent Labs:    Latest Ref Rng & Units 06/26/2023   10:28 AM 04/16/2023    3:27 PM 12/03/2022    2:11 PM  BMP  Glucose 70 - 99 mg/dL 90  72  84   BUN 6 - 24 mg/dL 16  12  9    Creatinine 0.76 - 1.27 mg/dL 2.95  2.84  1.32   BUN/Creat Ratio 9 - 20 16  12  9    Sodium 134 - 144 mmol/L 140  140  138   Potassium 3.5 - 5.2 mmol/L 5.4  4.7  4.5   Chloride 96 - 106 mmol/L 99  99  97   CO2 20 - 29 mmol/L 23  23  22    Calcium 8.7 - 10.2 mg/dL 9.9  44.0  9.5         Latest Ref Rng & Units 06/26/2023   10:28 AM 04/16/2023    3:27 PM 12/03/2022    2:11 PM  Hepatic Function  Total Protein 6.0 - 8.5 g/dL 7.5  7.3  7.0   Albumin 3.8 - 4.9 g/dL 4.7  4.8  4.7   AST 0 - 40 IU/L 158  112  67   ALT 0 - 44 IU/L 344  233  171   Alk Phosphatase 44 - 121 IU/L 111  94  104   Total Bilirubin 0.0 - 1.2 mg/dL 0.6  0.7  0.4        Latest Ref Rng & Units 06/26/2023   10:28 AM 04/16/2023    3:27 PM 10/22/2022    3:18 PM  CBC  WBC 3.4 - 10.8 x10E3/uL 6.3  7.7  7.1   Hemoglobin 13.0 - 17.7 g/dL 10.2  72.5  36.6   Hematocrit 37.5 - 51.0 %  49.4  48.1  49.9   Platelets 150 - 450 x10E3/uL 174  197  194    Lab Results  Component Value Date   MCV 93 06/26/2023   MCV 96 04/16/2023   MCV 90 10/22/2022   Lab Results  Component Value Date   TSH 3.400 04/16/2023   No results found for:  "HGBA1C"   BNP No results found for: "BNP"  ProBNP No results found for: "PROBNP"   Lipid Panel     Component Value Date/Time   CHOL 262 (H) 06/26/2023 1028   TRIG 367 (H) 06/26/2023 1028   HDL 45 06/26/2023 1028   CHOLHDL 5.8 (H) 06/26/2023 1028   LDLCALC 149 (H) 06/26/2023 1028   LABVLDL 68 (H) 06/26/2023 1028     RADIOLOGY: No results found.   Additional studies/ records that were reviewed today include:  Records of Dr. Sedalia Muta were reviewed.  ASSESSMENT:    1. Essential hypertension   2. Nonalcoholic steatohepatitis (NASH)   3. Mixed hyperlipidemia   4. Elevated liver enzymes   5. Evaluate for OSA (obstructive sleep apnea)   6. Alcohol use   7. Polycythemia, secondary     PLAN:  Mr. David Murphy is a 52 year old gentleman who is followed by Dr. Sedalia Muta and has a history of hypertension for at least 7 years.  He also has significant EtOH use history and since 2021 has had significant LFT elevation.  Presently he continues to drink daily of at least a sixpack or more per day.  Most recently, his lisinopril was discontinued and he was started on olmesartan 20 mg.  His blood pressure today continues to be elevated.  He tells me typically blood pressure at home may run around 138/94.  Presently, I am recommending he he increase olmesartan to 40 mg daily.  I have recommended he undergo a 2D echo Doppler study to evaluate both systolic and diastolic function, wall thickness and valves.  I have also recommended that he enter go an ultrasound of his liver in light of his consistent with elevated LFTs.  In September 2024, laboratory revealed marked hyperlipidemia with total cholesterol 242, LDL cholesterol 164.  He is not on statin therapy due to his LFTs.  He continues to take Zetia.  I am also concerned that he may very well have obstructive sleep apnea with his very poor sleep, frequent awakenings, snoring and nocturia.  I am recommending he undergo a in lab split-night sleep study  for optimal evaluation.  I am recommending repeat laboratory with a comprehensive metabolic panel, CBC, fasting lipid panel, TSH, and LP(a).  I will see him in 6 weeks for reevaluation or sooner as needed.   Medication Adjustments/Labs and Tests Ordered: Current medicines are reviewed at length with the patient today.  Concerns regarding medicines are outlined above.  Medication changes, Labs and Tests ordered today are listed in the Patient Instructions below. Patient Instructions  Medication Instructions:  Increase the Olmesartan from 20mg  to 40mg  daily.  Monitor your blood pressure.   Take blood pressure 2 hours after medication.   *If you need a refill on your cardiac medications before your next appointment, please call your pharmacy*   Lab Work: CBC, CMET, LIPID, LPa If you have labs (blood work) drawn today and your tests are completely normal, you will receive your results only by: MyChart Message (if you have MyChart) OR A paper copy in the mail If you have any lab test that is abnormal or we need to change  your treatment, we will call you to review the results.   Testing/Procedures: Your physician has requested that you have an echocardiogram. Echocardiography is a painless test that uses sound waves to create images of your heart. It provides your doctor with information about the size and shape of your heart and how well your heart's chambers and valves are working. This procedure takes approximately one hour. There are no restrictions for this procedure. Please do NOT wear cologne, perfume, aftershave, or lotions (deodorant is allowed). Please arrive 15 minutes prior to your appointment time.  Please note: We ask at that you not bring children with you during ultrasound (echo/ vascular) testing. Due to room size and safety concerns, children are not allowed in the ultrasound rooms during exams. Our front office staff cannot provide observation of children in our lobby area  while testing is being conducted. An adult accompanying a patient to their appointment will only be allowed in the ultrasound room at the discretion of the ultrasound technician under special circumstances. We apologize for any inconvenience.   -Calcium Score   Your physician has recommended that you have a sleep study. This test records several body functions during sleep, including: brain activity, eye movement, oxygen and carbon dioxide blood levels, heart rate and rhythm, breathing rate and rhythm, the flow of air through your mouth and nose, snoring, body muscle movements, and chest and belly movement.    Please note: We ask at that you not bring children with you during ultrasound (echo/ vascular) testing. Due to room size and safety concerns, children are not allowed in the ultrasound rooms during exams. Our front office staff cannot provide observation of children in our lobby area while testing is being conducted. An adult accompanying a patient to their appointment will only be allowed in the ultrasound room at the discretion of the ultrasound technician under special circumstances. We apologize for any inconvenience.    Your physician has requested that you have an abdominal aorta duplex. During this test, an ultrasound is used to evaluate the aorta. Allow 30 minutes for this exam. Do not eat after midnight the day before and avoid carbonated beverages.  Please note: We ask at that you not bring children with you during ultrasound (echo/ vascular) testing. Due to room size and safety concerns, children are not allowed in the ultrasound rooms during exams. Our front office staff cannot provide observation of children in our lobby area while testing is being conducted. An adult accompanying a patient to their appointment will only be allowed in the ultrasound room at the discretion of the ultrasound technician under special circumstances. We apologize for any inconvenience.    Follow-Up: At  [facility], you and your health needs are our priority.  As part of our continuing mission to provide you with exceptional heart care, we have created designated Provider Care Teams.  These Care Teams include your primary Cardiologist (physician) and Advanced Practice Providers (APPs -  Physician Assistants and Nurse Practitioners) who all work together to provide you with the care you need, when you need it.  We recommend signing up for the patient portal called "MyChart".  Sign up information is provided on this After Visit Summary.  MyChart is used to connect with patients for Virtual Visits (Telemedicine).  Patients are able to view lab/test results, encounter notes, upcoming appointments, etc.  Non-urgent messages can be sent to your provider as well.   To learn more about what you can do with MyChart, go  to [email or web address].    Your next appointment:   6 week(s)  Provider:   Dr. Nicki Guadalajara     Signed, Nicki Guadalajara, MD, [employer], ABSM Diplomate, American Board of Sleep Medicine  07/06/2023 11:37 AM    [facility] [address], [place], [place]  [address] Phone: [phone]

## 2023-06-27 LAB — COMPREHENSIVE METABOLIC PANEL
ALT: 344 [IU]/L — ABNORMAL HIGH (ref 0–44)
AST: 158 [IU]/L — ABNORMAL HIGH (ref 0–40)
Albumin: 4.7 g/dL (ref 3.8–4.9)
Alkaline Phosphatase: 111 [IU]/L (ref 44–121)
BUN/Creatinine Ratio: 16 (ref 9–20)
BUN: 16 mg/dL (ref 6–24)
Bilirubin Total: 0.6 mg/dL (ref 0.0–1.2)
CO2: 23 mmol/L (ref 20–29)
Calcium: 9.9 mg/dL (ref 8.7–10.2)
Chloride: 99 mmol/L (ref 96–106)
Creatinine, Ser: 0.98 mg/dL (ref 0.76–1.27)
Globulin, Total: 2.8 g/dL (ref 1.5–4.5)
Glucose: 90 mg/dL (ref 70–99)
Potassium: 5.4 mmol/L — ABNORMAL HIGH (ref 3.5–5.2)
Sodium: 140 mmol/L (ref 134–144)
Total Protein: 7.5 g/dL (ref 6.0–8.5)
eGFR: 93 mL/min/{1.73_m2} (ref >59)

## 2023-06-27 LAB — CBC
Hematocrit: 49.4 % (ref 37.5–51.0)
Hemoglobin: 16.1 g/dL (ref 13.0–17.7)
MCH: 30.4 pg (ref 26.6–33.0)
MCHC: 32.6 g/dL (ref 31.5–35.7)
MCV: 93 fL (ref 79–97)
Platelets: 174 10*3/uL (ref 150–450)
RBC: 5.29 x10E6/uL (ref 4.14–5.80)
RDW: 11.8 % (ref 11.6–15.4)
WBC: 6.3 10*3/uL (ref 3.4–10.8)

## 2023-06-27 LAB — LIPID PANEL
Chol/HDL Ratio: 5.8 ratio — ABNORMAL HIGH (ref 0.0–5.0)
Cholesterol, Total: 262 mg/dL — ABNORMAL HIGH (ref 100–199)
HDL: 45 mg/dL (ref >39)
LDL Chol Calc (NIH): 149 mg/dL — ABNORMAL HIGH (ref 0–99)
Triglycerides: 367 mg/dL — ABNORMAL HIGH (ref 0–149)
VLDL Cholesterol Cal: 68 mg/dL — ABNORMAL HIGH (ref 5–40)

## 2023-06-27 LAB — LIPOPROTEIN A (LPA): Lipoprotein (a): 19.6 nmol/L (ref <75.0)

## 2023-06-29 ENCOUNTER — Other Ambulatory Visit: Payer: Self-pay | Admitting: Family Medicine

## 2023-06-29 DIAGNOSIS — E782 Mixed hyperlipidemia: Secondary | ICD-10-CM

## 2023-07-06 ENCOUNTER — Other Ambulatory Visit: Payer: Self-pay

## 2023-07-06 ENCOUNTER — Encounter: Payer: Self-pay | Admitting: Cardiovascular Disease

## 2023-07-06 DIAGNOSIS — G4709 Other insomnia: Secondary | ICD-10-CM

## 2023-07-24 ENCOUNTER — Ambulatory Visit (HOSPITAL_COMMUNITY)
Admission: RE | Admit: 2023-07-24 | Discharge: 2023-07-24 | Disposition: A | Discharge status: Home / Self Care | Payer: Self-pay | Source: Ambulatory Visit | Attending: Cardiovascular Disease | Admitting: Cardiovascular Disease

## 2023-07-24 ENCOUNTER — Ambulatory Visit (HOSPITAL_COMMUNITY)
Admission: RE | Admit: 2023-07-24 | Discharge: 2023-07-24 | Disposition: A | Discharge status: Home / Self Care | Payer: Commercial Managed Care - PPO | Source: Ambulatory Visit | Attending: Cardiovascular Disease | Admitting: Cardiovascular Disease

## 2023-07-24 DIAGNOSIS — K7581 Nonalcoholic steatohepatitis (NASH): Secondary | ICD-10-CM | POA: Diagnosis present

## 2023-07-24 DIAGNOSIS — E782 Mixed hyperlipidemia: Secondary | ICD-10-CM

## 2023-07-28 NOTE — Progress Notes (Unsigned)
Subjective:  Patient ID: David Murphy, male    DOB: [birth year]  Age: 52 y.o. MRN: [id number]  Chief Complaint  Patient presents with   Annual Exam    HPI  Well Adult Physical: Patient here for a comprehensive physical exam.The patient reports no problems Do you take any herbs or supplements that were not prescribed by a doctor? no Are you taking calcium supplements? no Are you taking aspirin daily? no  Encounter for general adult medical examination without abnormal findings  Physical ("At Risk" items are starred): Patient's last physical exam was 1 year ago .  Patient wears a seat belt, has smoke detectors, has carbon monoxide detectors, practices appropriate gun safety, and wears sunscreen with extended sun exposure. Dental Care: biannual cleanings, brushes and flosses daily. Ophthalmology/Optometry: Does not do annual visits.  Hearing loss: none Vision impairments: none Last PSA: 1.8     07/29/2023    3:17 PM 04/16/2023    2:49 PM 10/22/2022    2:39 PM 10/16/2021    3:16 PM 06/05/2021    2:01 PM  Depression screen PHQ 2/9  Decreased Interest 0 0 0 0 0  Down, Depressed, Hopeless 0 0 0 0 0  PHQ - 2 Score 0 0 0 0 0  Altered sleeping 0 0     Tired, decreased energy 0 0     Change in appetite 0 0     Feeling bad or failure about yourself  0 0     Trouble concentrating 0 0     Moving slowly or fidgety/restless 0 0     Suicidal thoughts 0 0     PHQ-9 Score 0 0     Difficult doing work/chores Not difficult at all Not difficult at all            06/05/2021    2:01 PM 11/22/2021    3:13 PM 10/22/2022    2:39 PM 04/16/2023    2:49 PM 07/29/2023    3:16 PM  Fall Risk  Falls in the past year? 0  0 0 0  Was there an injury with Fall? 0  0 0 0  Fall Risk Category Calculator 0  0 0 0  Fall Risk Category (Retired) Low      (RETIRED) Patient Fall Risk Level Low fall risk Low fall risk     Patient at Risk for Falls Due to No Fall Risks  No Fall Risks No Fall Risks No Fall Risks   Fall risk Follow up Falls evaluation completed  Falls evaluation completed Falls evaluation completed;Follow up appointment               Past Medical History:  Diagnosis Date   Acute hepatitis B without delta-agent and without hepatic coma    Essential hypertension    Mixed hyperlipidemia    Primary insomnia    Testicular hypofunction    Past Surgical History:  Procedure Laterality Date   HERNIA REPAIR      Family History  Problem Relation Age of Onset   Hyperlipidemia Mother    Diabetes Mother    Hyperlipidemia Father    Social History   Socioeconomic History   Marital status: Divorced    Spouse name: Not on file   Number of children: 1   Years of education: Not on file   Highest education level: Not on file  Occupational History   Occupation: Delivery Driver    Comment: [employer]  Tobacco Use   Smoking status: Never  Smokeless tobacco: Never  Vaping Use   Vaping status: Never Used  Substance and Sexual Activity   Alcohol use: Yes    Alcohol/week: 35.0 standard drinks of alcohol    Types: 35 Cans of beer per week    Comment: Alcoholic, currently not in treatment, drink 4-5 cans of beer daily, some more   Drug use: Never   Sexual activity: Not Currently  Other Topics Concern   Not on file  Social History Narrative   Not on file   Social Drivers of Health   Financial Resource Strain: Low Risk  (10/22/2022)   Overall Financial Resource Strain (CARDIA)    Difficulty of Paying Living Expenses: Not hard at all  Food Insecurity: No Food Insecurity (10/22/2022)   Hunger Vital Sign    Worried About Running Out of Food in the Last Year: Never true    Ran Out of Food in the Last Year: Never true  Transportation Needs: No Transportation Needs (10/22/2022)   PRAPARE - Administrator, Civil Service (Medical): No    Lack of Transportation (Non-Medical): No  Physical Activity: Insufficiently Active (10/16/2021)   Exercise Vital Sign    Days of Exercise  per Week: 2 days    Minutes of Exercise per Session: 20 min  Stress: No Stress Concern Present (10/22/2022)   [employer] of Occupational Health - Occupational Stress Questionnaire    Feeling of Stress : Not at all  Social Connections: Socially Isolated (10/22/2022)   Social Connection and Isolation Panel [NHANES]    Frequency of Communication with Friends and Family: More than three times a week    Frequency of Social Gatherings with Friends and Family: More than three times a week    Attends Religious Services: Never    Database administrator or Organizations: No    Attends Banker Meetings: Never    Marital Status: Divorced   Review of Systems  Constitutional:  Negative for chills, diaphoresis, fatigue and fever.  HENT:  Negative for congestion, ear pain and sore throat.   Respiratory:  Negative for cough and shortness of breath.   Cardiovascular:  Negative for chest pain and leg swelling.  Gastrointestinal:  Negative for abdominal pain, constipation, diarrhea, nausea and vomiting.  Genitourinary:  Negative for dysuria and urgency.  Musculoskeletal:  Negative for arthralgias and myalgias.  Neurological:  Negative for dizziness and headaches.  Psychiatric/Behavioral:  Negative for dysphoric mood.      Objective:  BP (!) 182/108   Pulse 97   Temp 97.8 F (36.6 C)   Ht 6' (1.829 m)   Wt 244 lb (110.7 kg)   SpO2 98%   BMI 33.09 kg/m      07/29/2023    3:27 PM 07/29/2023    3:13 PM 06/26/2023    8:30 AM  BP/Weight  Systolic BP 182 142 150  Diastolic BP 108 110 110  Wt. (Lbs)  244 243.8  BMI  33.09 kg/m2 33.07 kg/m2    Physical Exam Vitals reviewed.  Constitutional:      Appearance: Normal appearance.  HENT:     Right Ear: Tympanic membrane, ear canal and external ear normal.     Left Ear: Tympanic membrane, ear canal and external ear normal.     Nose: Nose normal. No congestion or rhinorrhea.     Mouth/Throat:     Mouth: Mucous membranes are  moist.     Pharynx: No oropharyngeal exudate or posterior oropharyngeal erythema.  Neck:  Vascular: No carotid bruit.  Cardiovascular:     Rate and Rhythm: Normal rate and regular rhythm.     Pulses: Normal pulses.     Heart sounds: Normal heart sounds.  Pulmonary:     Effort: Pulmonary effort is normal. No respiratory distress.     Breath sounds: Normal breath sounds. No wheezing, rhonchi or rales.  Abdominal:     General: Bowel sounds are normal.     Palpations: Abdomen is soft.     Tenderness: There is no abdominal tenderness.  Lymphadenopathy:     Cervical: No cervical adenopathy.  Neurological:     Mental Status: He is alert.  Psychiatric:        Mood and Affect: Mood normal.        Behavior: Behavior normal.     Lab Results  Component Value Date   WBC 6.3 06/26/2023   HGB 16.1 06/26/2023   HCT 49.4 06/26/2023   PLT 174 06/26/2023   GLUCOSE 90 06/26/2023   CHOL 262 (H) 06/26/2023   TRIG 367 (H) 06/26/2023   HDL 45 06/26/2023   LDLCALC 149 (H) 06/26/2023   ALT 344 (H) 06/26/2023   AST 158 (H) 06/26/2023   NA 140 06/26/2023   K 5.4 (H) 06/26/2023   CL 99 06/26/2023   CREATININE 0.98 06/26/2023   BUN 16 06/26/2023   CO2 23 06/26/2023   TSH 3.400 04/16/2023      Assessment & Plan:  Routine medical exam Assessment & Plan: Things to do to keep yourself healthy  - Exercise at least 30-45 minutes a day, 3-4 days a week.  - Eat a low-fat diet with lots of fruits and vegetables, up to 7-9 servings per day.  - Seatbelts can save your life. Wear them always.  - Smoke detectors on every level of your home, check batteries every year.  - Eye Doctor - have an eye exam every 1-2 years  - Safe sex - if you may be exposed to STDs, use a condom.  - Alcohol -  If you drink, do it moderately, less than 2 drinks per day.  - Health Care Power of Attorney. Choose someone to speak for you if you are not able.  - Depression is common in our stressful world.If you're feeling  down or losing interest in things you normally enjoy, please come in for a visit.  - Violence - If anyone is threatening or hurting you, please call immediately.   Other insomnia Assessment & Plan: The current medical regimen is effective;  continue present plan and medications. Zolpidem 10 mg at bedtime.  Orders: -     Zolpidem Tartrate; Take 1 tablet (10 mg total) by mouth at bedtime.  Dispense: 90 tablet; Refill: 0  Alcohol use Assessment & Plan: Start naltrexone 50 mg daily for alcoholism. Call AA.   Orders: -     Naltrexone HCl; Take 1 tablet (50 mg total) by mouth daily.  Dispense: 90 tablet; Refill: 1  Essential hypertension Assessment & Plan: Start amlodipine 5 mg daily for hypertension.  Orders: -     amLODIPine Besylate; Take 1 tablet (5 mg total) by mouth daily.  Dispense: 90 tablet; Refill: 0 -     Olmesartan Medoxomil; Take 1 tablet (40 mg total) by mouth daily.  Dispense: 90 tablet; Refill: 0  Other orders -     Cyclobenzaprine HCl; Take 1 tablet (10 mg total) by mouth 3 (three) times daily as needed. for muscle spams  Dispense: 270  tablet; Refill: 1     Body mass index is 33.09 kg/m.   These are the goals we discussed:  Goals   None      This is a list of the screening recommended for you and due dates:  Health Maintenance  Topic Date Due   Cologuard (Stool DNA test)  Never done   HIV Screening  10/22/2023*   Zoster (Shingles) Vaccine (1 of 2) 10/27/2023*   Flu Shot  11/11/2023*   Hepatitis C Screening  Completed   HPV Vaccine  Aged Out   DTaP/Tdap/Td vaccine  Discontinued   COVID-19 Vaccine  Discontinued  *Topic was postponed. The date shown is not the original due date.     Meds ordered this encounter  Medications   naltrexone (DEPADE) 50 MG tablet    Sig: Take 1 tablet (50 mg total) by mouth daily.    Dispense:  90 tablet    Refill:  1   amLODipine (NORVASC) 5 MG tablet    Sig: Take 1 tablet (5 mg total) by mouth daily.    Dispense:  90  tablet    Refill:  0   cyclobenzaprine (FLEXERIL) 10 MG tablet    Sig: Take 1 tablet (10 mg total) by mouth 3 (three) times daily as needed. for muscle spams    Dispense:  270 tablet    Refill:  1   olmesartan (BENICAR) 40 MG tablet    Sig: Take 1 tablet (40 mg total) by mouth daily.    Dispense:  90 tablet    Refill:  0   zolpidem (AMBIEN) 10 MG tablet    Sig: Take 1 tablet (10 mg total) by mouth at bedtime.    Dispense:  90 tablet    Refill:  0     Follow-up: Return in about 3 months (around 10/27/2023) for chronic fasting.  An After Visit Summary was printed and given to the patient.   Clayborn Bigness I Leal-Borjas,acting as a scribe for Blane Ohara, MD.,have documented all relevant documentation on the behalf of Blane Ohara, MD,as directed by  Blane Ohara, MD while in the presence of Blane Ohara, MD.   Blane Ohara, MD [facility] [phone]

## 2023-07-29 ENCOUNTER — Encounter: Payer: Self-pay | Admitting: Family Medicine

## 2023-07-29 ENCOUNTER — Ambulatory Visit (INDEPENDENT_AMBULATORY_CARE_PROVIDER_SITE_OTHER): Payer: Commercial Managed Care - PPO | Admitting: Family Medicine

## 2023-07-29 VITALS — BP 182/108 | HR 97 | Temp 97.8°F | Ht 72.0 in | Wt 244.0 lb

## 2023-07-29 DIAGNOSIS — Z0000 Encounter for general adult medical examination without abnormal findings: Secondary | ICD-10-CM

## 2023-07-29 DIAGNOSIS — I10 Essential (primary) hypertension: Secondary | ICD-10-CM | POA: Diagnosis not present

## 2023-07-29 DIAGNOSIS — G4709 Other insomnia: Secondary | ICD-10-CM

## 2023-07-29 DIAGNOSIS — Z789 Other specified health status: Secondary | ICD-10-CM | POA: Diagnosis not present

## 2023-07-29 MED ORDER — NALTREXONE HCL 50 MG PO TABS: 50.0000 mg | ORAL_TABLET | Freq: Every day | ORAL | 1 refills | Status: DC

## 2023-07-29 MED ORDER — AMLODIPINE BESYLATE 5 MG PO TABS: 5.0000 mg | ORAL_TABLET | Freq: Every day | ORAL | 0 refills | Status: DC

## 2023-07-29 MED ORDER — ZOLPIDEM TARTRATE 10 MG PO TABS: 10.0000 mg | ORAL_TABLET | Freq: Every day | ORAL | 0 refills | Status: DC

## 2023-07-29 MED ORDER — OLMESARTAN MEDOXOMIL 40 MG PO TABS: 40.0000 mg | ORAL_TABLET | Freq: Every day | ORAL | 0 refills | Status: DC

## 2023-07-29 MED ORDER — CYCLOBENZAPRINE HCL 10 MG PO TABS: 10.0000 mg | ORAL_TABLET | Freq: Three times a day (TID) | ORAL | 1 refills | Status: DC | PRN

## 2023-07-29 NOTE — Patient Instructions (Signed)
Start naltrexone 50 mg daily for alcoholism. Call AA.  Start amlodipine 5 mg daily for hypertension.    Things to do to keep yourself healthy  - Exercise at least 30-45 minutes a day, 3-4 days a week.  - Eat a low-fat diet with lots of fruits and vegetables, up to 7-9 servings per day.  - Seatbelts can save your life. Wear them always.  - Smoke detectors on every level of your home, check batteries every year.  - Eye Doctor - have an eye exam every 1-2 years  - Safe sex - if you may be exposed to STDs, use a condom.  - Alcohol -  If you drink, do it moderately, less than 2 drinks per day.  - Health Care Power of Attorney. Choose someone to speak for you if you are not able.  - Depression is common in our stressful world.If you're feeling down or losing interest in things you normally enjoy, please come in for a visit.  - Violence - If anyone is threatening or hurting you, please call immediately.

## 2023-07-31 ENCOUNTER — Other Ambulatory Visit (HOSPITAL_COMMUNITY): Payer: Commercial Managed Care - PPO

## 2023-07-31 NOTE — Assessment & Plan Note (Addendum)
Uncontrolled. Start amlodipine 5 mg daily for hypertension. Continue olmesartan 40 mg daily.

## 2023-07-31 NOTE — Assessment & Plan Note (Signed)
Start naltrexone 50 mg daily for alcoholism. Call AA.

## 2023-08-02 ENCOUNTER — Ambulatory Visit (HOSPITAL_COMMUNITY): Payer: Commercial Managed Care - PPO | Attending: Cardiovascular Disease

## 2023-08-02 DIAGNOSIS — I10 Essential (primary) hypertension: Secondary | ICD-10-CM | POA: Diagnosis present

## 2023-08-02 LAB — ECHOCARDIOGRAM COMPLETE
Area-P 1/2: 4.26 cm2
S' Lateral: 2.2 cm

## 2023-08-03 DIAGNOSIS — Z0000 Encounter for general adult medical examination without abnormal findings: Secondary | ICD-10-CM | POA: Insufficient documentation

## 2023-08-03 NOTE — Assessment & Plan Note (Signed)
The current medical regimen is effective;  continue present plan and medications. Zolpidem 10 mg at bedtime.

## 2023-08-03 NOTE — Assessment & Plan Note (Signed)

## 2023-08-13 LAB — COLOGUARD: COLOGUARD: NEGATIVE

## 2023-08-16 ENCOUNTER — Encounter: Payer: Self-pay | Admitting: Cardiovascular Disease

## 2023-08-26 ENCOUNTER — Ambulatory Visit: Payer: Commercial Managed Care - PPO | Admitting: Cardiovascular Disease

## 2023-09-17 NOTE — Progress Notes (Signed)
 Cardiology Office Note:    Date:  09/23/2023   ID:  David Murphy, DOB 1971/01/06, MRN [id number]  PCP:  David Stall, MD   [facility] Providers Cardiologist:  None     Referring MD: David Stall, MD   Chief Complaint  Patient presents with   Hypertension    History of Present Illness:    David Murphy is a 53 y.o. male is seen for cardiac follow up. Previously seen by Dr Loetta Ringer. He has a history of HTN, HLD, elevated LFTs. Prior evaluation with Echo was normal. Coronary calcium  score was 35 placing him at 75th percentile. He had abd US  showing hepatic steatosis. He denies any chest pain or dyspnea. Is tolerating BP meds well. 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 Admits he eats a lot of red meat in his diet.  Past Medical History:  Diagnosis Date   Acute hepatitis B without delta-agent and without hepatic coma    Essential hypertension    Mixed hyperlipidemia    Primary insomnia    Testicular hypofunction     Past Surgical History:  Procedure Laterality Date   HERNIA REPAIR      Current Medications: Current Meds  Medication Sig   amLODipine  (NORVASC ) 5 MG tablet Take 1 tablet (5 mg total) by mouth daily.   cyclobenzaprine  (FLEXERIL ) 10 MG tablet Take 1 tablet (10 mg total) by mouth 3 (three) times daily as needed. for muscle spams   ezetimibe  (ZETIA ) 10 MG tablet Take 1 tablet by mouth once daily   Krill Oil 500 MG CAPS Take 500 mg by mouth daily.   naltrexone  (DEPADE) 50 MG tablet Take 1 tablet (50 mg total) by mouth daily.   olmesartan  (BENICAR ) 40 MG tablet Take 1 tablet (40 mg total) by mouth daily.   zolpidem  (AMBIEN ) 10 MG tablet Take 1 tablet (10 mg total) by mouth at bedtime.     Allergies:   Patient has no known allergies.   Social History   Socioeconomic History   Marital status: Divorced    Spouse name: Not on file   Number of children: 1   Years of education: Not on file   Highest education level:  Not on file  Occupational History   Occupation: Delivery Driver    Comment: [employer]  Tobacco Use   Smoking status: Never   Smokeless tobacco: Never  Vaping Use   Vaping status: Never Used  Substance and Sexual Activity   Alcohol use: Yes    Alcohol/week: 35.0 standard drinks of alcohol    Types: 35 Cans of beer per week    Comment: Alcoholic, currently not in treatment, drink 4-5 cans of beer daily, some more   Drug use: Never   Sexual activity: Not Currently    Partners: Female  Other Topics Concern   Not on file  Social History Narrative   Not on file   Social Drivers of Health   Financial Resource Strain: Low Risk  (10/22/2022)   Overall Financial Resource Strain (CARDIA)    Difficulty of Paying Living Expenses: Not hard at all  Food Insecurity: No Food Insecurity (10/22/2022)   Hunger Vital Sign    Worried About Running Out of Food in the Last Year: Never true    Ran Out of Food in the Last Year: Never true  Transportation Needs: No Transportation Needs (10/22/2022)   PRAPARE - Administrator, Civil Service (Medical): No  Lack of Transportation (Non-Medical): No  Physical Activity: Insufficiently Active (10/16/2021)   Exercise Vital Sign    Days of Exercise per Week: 2 days    Minutes of Exercise per Session: 20 min  Stress: No Stress Concern Present (10/22/2022)   [employer] of Occupational Health - Occupational Stress Questionnaire    Feeling of Stress : Not at all  Social Connections: Socially Isolated (10/22/2022)   Social Connection and Isolation Panel [NHANES]    Frequency of Communication with Friends and Family: More than three times a week    Frequency of Social Gatherings with Friends and Family: More than three times a week    Attends Religious Services: Never    Database administrator or Organizations: No    Attends Engineer, structural: Never    Marital Status: Divorced     Family History: The patient's family history  includes Diabetes in his mother; Hyperlipidemia in his father and mother.  ROS:   Please see the history of present illness.     All other systems reviewed and are negative.  EKGs/Labs/Other Studies Reviewed:    The following studies were reviewed today: Echo 08/02/23: IMPRESSIONS     1. Left ventricular ejection fraction, by estimation, is 70 to 75%. The  left ventricle has hyperdynamic function. The left ventricle has no  regional wall motion abnormalities. Left ventricular diastolic parameters  are consistent with Grade I diastolic  dysfunction (impaired relaxation).   2. Right ventricular systolic function is normal. The right ventricular  size is mildly enlarged.   3. The mitral valve is grossly normal. No evidence of mitral valve  regurgitation.   4. The aortic valve is tricuspid. Aortic valve regurgitation is not  visualized.   5. The inferior vena cava is normal in size with greater than 50%  respiratory variability, suggesting right atrial pressure of 3 mmHg.   Comparison(s): No prior Echocardiogram      OVER-READ INTERPRETATION  CT CHEST   The following report is an over-read performed by radiologist Dr. Babs Bolster [facility] on 07/27/2023. This over-read does not include interpretation of cardiac or coronary anatomy or pathology. The coronary calcium  score interpretation by the cardiologist is attached.   COMPARISON:  None available.   FINDINGS: Within the visualized portions of the thorax there are no suspicious appearing pulmonary nodules or masses, there is no acute consolidative airspace disease, no pleural effusions, no pneumothorax and no lymphadenopathy. Visualized portions of the upper abdomen demonstrate diffuse low attenuation throughout the visualized hepatic parenchyma, indicative of a background of hepatic steatosis. There are no aggressive appearing lytic or blastic lesions noted in the visualized portions of the skeleton.    IMPRESSION: 1. Hepatic steatosis.     Electronically Signed   By: Alexandria Angel M.D.   On: 07/27/2023 13:08    Addended by Elin Guan, MD on 07/27/2023  1:10 PM    Study Result  Narrative & Impression  CLINICAL DATA:  Risk stratification: 53 Year-old Male   EXAM: Coronary Calcium  Score   TECHNIQUE: The patient was scanned on a [employer] scanner. Axial non-contrast 3 mm slices were carried out through the heart. The data set was analyzed on a dedicated work station and scored using the Agatson method.   FINDINGS: Non-cardiac: See separate report from [facility].   Ascending Aorta: Normal caliber. No calcifications.   Aortic Valve Calcium  score: 0   Mitral annular calcification: 0   Pericardium: Normal.   Coronary  arteries: Normal origins.   Coronary Calcium  Score:   Left main: 0   Left anterior descending artery: 0   Left circumflex artery: 0   Right coronary artery: 35   Total: 35   Percentile: 76th for age, sex, and race matched control.   IMPRESSION: 1. Coronary calcium  score of 35. This was 76th percentile for age, gender, and race matched controls.    Recent Labs: 04/16/2023: TSH 3.400 06/26/2023: ALT 344; BUN 16; Creatinine, Ser 0.98; Hemoglobin 16.1; Platelets 174; Potassium 5.4; Sodium 140  Recent Lipid Panel    Component Value Date/Time   CHOL 262 (H) 06/26/2023 1028   TRIG 367 (H) 06/26/2023 1028   HDL 45 06/26/2023 1028   CHOLHDL 5.8 (H) 06/26/2023 1028   LDLCALC 149 (H) 06/26/2023 1028     Risk Assessment/Calculations:                Physical Exam:    VS:  BP 130/80   Pulse (!) 111   Ht 6' (1.829 m)   Wt 244 lb 12.8 oz (111 kg)   SpO2 92%   BMI 33.20 kg/m     Wt Readings from Last 3 Encounters:  09/23/23 244 lb 12.8 oz (111 kg)  07/29/23 244 lb (110.7 kg)  06/26/23 243 lb 12.8 oz (110.6 kg)     GEN:  Well nourished, overweight, in no acute distress HEENT: Normal NECK: No JVD; No carotid  bruits LYMPHATICS: No lymphadenopathy CARDIAC: RRR, no murmurs, rubs, gallops RESPIRATORY:  Clear to auscultation without rales, wheezing or rhonchi  ABDOMEN: Soft, non-tender, non-distended MUSCULOSKELETAL:  No edema; No deformity  SKIN: Warm and dry NEUROLOGIC:  Alert and oriented x 3 PSYCHIATRIC:  Normal affect   ASSESSMENT:    1. Essential hypertension   2. Nonalcoholic steatohepatitis (NASH)   3. Mixed hyperlipidemia   4. Elevated liver enzymes    PLAN:    In order of problems listed above:  HTN now well controlled on amlodipine  and olmesartan . Reinforced low sodium diet. Encourage aerobic activity and weight loss Coronary artery calcification. Asymptomatic. Recommend focusing on risk factor modification. Hyperlipidemia combined. Stressed importance of dietary therapy with weight loss. Needs to restrict sweets/sodas and simple carbs. Significantly reduce intake of red meat with more of a Mediterranean style diet. Given high LFTs he is not a good candidate for statins. Will refer to Pharm D for consideration of Repatha .  Will follow up in 6 months with APP           Medication Adjustments/Labs and Tests Ordered: Current medicines are reviewed at length with the patient today.  Concerns regarding medicines are outlined above.  No orders of the defined types were placed in this encounter.  No orders of the defined types were placed in this encounter.   There are no Patient Instructions on file for this visit.   Signed, Zyla Dascenzo [place], MD  09/23/2023 8:48 AM    [facility]

## 2023-09-23 ENCOUNTER — Ambulatory Visit: Payer: Commercial Managed Care - PPO | Attending: Cardiology | Admitting: Cardiology

## 2023-09-23 ENCOUNTER — Other Ambulatory Visit: Payer: Self-pay | Admitting: Family Medicine

## 2023-09-23 ENCOUNTER — Encounter: Payer: Self-pay | Admitting: Cardiology

## 2023-09-23 VITALS — BP 130/80 | HR 111 | Ht 72.0 in | Wt 244.8 lb

## 2023-09-23 DIAGNOSIS — K7581 Nonalcoholic steatohepatitis (NASH): Secondary | ICD-10-CM

## 2023-09-23 DIAGNOSIS — E782 Mixed hyperlipidemia: Secondary | ICD-10-CM | POA: Diagnosis not present

## 2023-09-23 DIAGNOSIS — R748 Abnormal levels of other serum enzymes: Secondary | ICD-10-CM

## 2023-09-23 DIAGNOSIS — I10 Essential (primary) hypertension: Secondary | ICD-10-CM

## 2023-09-23 NOTE — Patient Instructions (Signed)
 Medication Instructions:  Continue same medications *If you need a refill on your cardiac medications before your next appointment, please call your pharmacy*   Lab Work: None ordered   Testing/Procedures: None ordered   Follow-Up: At [facility], you and your health needs are our priority.  As part of our continuing mission to provide you with exceptional heart care, we have created designated Provider Care Teams.  These Care Teams include your primary Cardiologist (physician) and Advanced Practice Providers (APPs -  Physician Assistants and Nurse Practitioners) who all work together to provide you with the care you need, when you need it.  We recommend signing up for the patient portal called "MyChart".  Sign up information is provided on this After Visit Summary.  MyChart is used to connect with patients for Virtual Visits (Telemedicine).  Patients are able to view lab/test results, encounter notes, upcoming appointments, etc.  Non-urgent messages can be sent to your provider as well.   To learn more about what you can do with MyChart, go to [email or web address].    Your next appointment:  Call in April to schedule August appointment     Provider:  PA            Schedule appointment with Pharmacist in Lipid Clinic           Follow low carb and low sodium diet

## 2023-09-27 ENCOUNTER — Encounter: Payer: Self-pay | Admitting: Pharmacist

## 2023-09-27 ENCOUNTER — Ambulatory Visit: Payer: Commercial Managed Care - PPO | Attending: Internal Medicine | Admitting: Pharmacist

## 2023-09-27 ENCOUNTER — Telehealth: Payer: Self-pay | Admitting: Pharmacist

## 2023-09-27 DIAGNOSIS — E782 Mixed hyperlipidemia: Secondary | ICD-10-CM

## 2023-09-27 DIAGNOSIS — R931 Abnormal findings on diagnostic imaging of heart and coronary circulation: Secondary | ICD-10-CM

## 2023-09-27 NOTE — Telephone Encounter (Signed)
Please complete PA for Repatha

## 2023-09-27 NOTE — Patient Instructions (Signed)
It was nice meeting you today  We would like your LDL (bad cholesterol) to be less than 70  The medication we discussed today is called Repatha which is an injection you would take once every 2 weeks  I will complete the prior authorization and contact you when it is approved  Once you start the medication we will recheck your fasting lipid panel in 2-3 months  You can stop your ezetimibe at this time  Please call with any questions  Laural Golden, PharmD, BCACP, CDCES, CPP [address] [place], [place], [address] Phone: [phone] Fax: [phone]

## 2023-09-27 NOTE — Progress Notes (Signed)
Patient ID: David Murphy                 DOB: [birth year]                    MRN: [id number]     HPI: David Murphy is a 53 y.o. male patient referred to lipid clinic by Dr [place]. PMH is significant for HTN, hep B, NASH, elevated liver enzymes, and HLD.   Patient presents today to discuss lipid management. Currently managed on Zetia 10mg . Has previously been on atorvastatin which was discontinued.  Patient has ongoing history of NASH and elevated LFTs. Last ALT 233 and AST 112.  Diet is high in meats and protein. Beef, chicken, fish, pork. Does have vegetable with each meal.  Does not follow any formal exercise plan but reports he gets 10K-12K steps at work.  Current Medications:  Ezetimibe 10mg  daily  LDL goal: <70  Labs: TC 262, Trigs 367, HDL 45, LDL 149 (06/26/23)  Imaging: IMPRESSION: 1. Coronary calcium score of 35. This was 76th percentile for age,gender, and race matched controls.  Past Medical History:  Diagnosis Date   Acute hepatitis B without delta-agent and without hepatic coma    Essential hypertension    Mixed hyperlipidemia    Primary insomnia    Testicular hypofunction     Current Outpatient Medications on File Prior to Visit  Medication Sig Dispense Refill   amLODipine (NORVASC) 5 MG tablet Take 1 tablet (5 mg total) by mouth daily. 90 tablet 0   cyclobenzaprine (FLEXERIL) 10 MG tablet Take 1 tablet (10 mg total) by mouth 3 (three) times daily as needed. for muscle spams 270 tablet 1   ezetimibe (ZETIA) 10 MG tablet Take 1 tablet by mouth once daily 90 tablet 0   Krill Oil 500 MG CAPS Take 500 mg by mouth daily.     naltrexone (DEPADE) 50 MG tablet Take 1 tablet (50 mg total) by mouth daily. 90 tablet 1   olmesartan (BENICAR) 40 MG tablet Take 1 tablet (40 mg total) by mouth daily. 90 tablet 0   zolpidem (AMBIEN) 10 MG tablet Take 1 tablet (10 mg total) by mouth at bedtime. 90 tablet 0   No current facility-administered medications on file prior to  visit.    No Known Allergies  Assessment/Plan:  1. Hyperlipidemia - Patient last LDL 149 which is above goal of <70 due to CAC. No longer a candidate for statins due to chronic LFT elevation. Recommend starting PCSK9i.  Using demo pen, educated patient on mechanism of action, storage, site selection, administration, and possible adverse effects. Patient able to demonstrate in room. Will complete PA and contact patient when approved. Advised to download copay card from manufacturer website. Patient can stop ezetimibe when he starts Repatha.  Start Repatha 140mg  q 2 weeks Recheck lipid panel and hepatic function tests in 3 months  Laural Golden, PharmD, BCACP, CDCES, CPP [address] [place], [place], [address] Phone: [phone], Fax: [phone]

## 2023-09-30 ENCOUNTER — Other Ambulatory Visit (HOSPITAL_COMMUNITY): Payer: Self-pay

## 2023-09-30 ENCOUNTER — Telehealth: Payer: Self-pay | Admitting: Pharmacy Technician

## 2023-09-30 NOTE — Telephone Encounter (Signed)
Pharmacy Patient Advocate Encounter   Received notification from Pt Calls Messages that prior authorization for repatha is required/requested.   Insurance verification completed.   The patient is insured through [facility] .   Per test claim: PA required; PA submitted to above mentioned insurance via Prompt PA Key/confirmation #/[id number] Status is pending

## 2023-10-02 ENCOUNTER — Telehealth: Payer: Self-pay | Admitting: Pharmacy Technician

## 2023-10-02 ENCOUNTER — Other Ambulatory Visit (HOSPITAL_COMMUNITY): Payer: Self-pay

## 2023-10-02 DIAGNOSIS — E782 Mixed hyperlipidemia: Secondary | ICD-10-CM

## 2023-10-02 DIAGNOSIS — R931 Abnormal findings on diagnostic imaging of heart and coronary circulation: Secondary | ICD-10-CM

## 2023-10-02 DIAGNOSIS — R748 Abnormal levels of other serum enzymes: Secondary | ICD-10-CM

## 2023-10-02 NOTE — Telephone Encounter (Addendum)
Pharmacy Patient Advocate Encounter  Received notification from [employer] that Prior Authorization for repatha has been DENIED.  See denial reason below. No denial letter attached in CMM. Will attach denial letter to Media tab once received.  I called [phone] to ask why this prior David Murphy was denied and they said the patient has only tried atrovastatin and even though that elevated his liver enzymes they denied it saying they want him to try 2 statins before this will be approved. Denial scanned in media

## 2023-10-02 NOTE — Telephone Encounter (Signed)
Faxed appeal 10/02/23 12:13pm

## 2023-10-04 ENCOUNTER — Other Ambulatory Visit (HOSPITAL_COMMUNITY): Payer: Self-pay

## 2023-10-07 ENCOUNTER — Other Ambulatory Visit (HOSPITAL_COMMUNITY): Payer: Self-pay

## 2023-10-08 ENCOUNTER — Other Ambulatory Visit (HOSPITAL_COMMUNITY): Payer: Self-pay

## 2023-10-08 MED ORDER — REPATHA SURECLICK 140 MG/ML ~~LOC~~ SOAJ: 1.0000 mL | SUBCUTANEOUS | 1 refills | Status: DC

## 2023-10-08 NOTE — Addendum Note (Signed)
 Addended by: Cheree Ditto on: 10/08/2023 01:47 PM   Modules accepted: Orders

## 2023-10-08 NOTE — Telephone Encounter (Signed)
 Pharmacy Patient Advocate Encounter  Received notification from  [employer]  that Prior Authorization for repatha has been APPROVED from 10/07/23 to 04/05/24. Ran test claim, Copay is $24.99. This test claim was processed through [facility]- copay amounts may vary at other pharmacies due to pharmacy/plan contracts, or as the patient moves through the different stages of their insurance plan.

## 2023-10-23 ENCOUNTER — Other Ambulatory Visit: Payer: Self-pay | Admitting: Family Medicine

## 2023-10-23 DIAGNOSIS — I10 Essential (primary) hypertension: Secondary | ICD-10-CM

## 2023-11-06 ENCOUNTER — Ambulatory Visit: Payer: Commercial Managed Care - PPO | Admitting: Family Medicine

## 2023-11-11 ENCOUNTER — Other Ambulatory Visit: Payer: Self-pay | Admitting: Family Medicine

## 2023-11-11 DIAGNOSIS — G4709 Other insomnia: Secondary | ICD-10-CM

## 2023-12-03 ENCOUNTER — Encounter: Payer: Self-pay | Admitting: Family Medicine

## 2023-12-03 NOTE — Progress Notes (Unsigned)
 Subjective:  Patient ID: David Murphy, male    DOB: 1971-03-21  Age: 53 y.o. MRN: [id number]  Chief Complaint  Patient presents with   Hyperlipidemia    HPI: Hyperlipidemia: Patient is taking Zetia  10 mg daily, Krill Oil 500mg  one twice a day.   Hypertension: He takes Lisinopril  40 mg daily.   Testicular Hypofunction: Testosterone  1.62% gel apply 3 pumps topically once a day to clean, dry, intact skin. Does not seem if it is helping.     Insomnia: Zolpidem  10 mg daily at bedtime. Not working as well. Failed on melatonin. Requesting to increase dose of ambien .   History of elevated LFTs. Hep panel has been negative. 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      07/29/2023    3:17 PM 04/16/2023    2:49 PM 10/22/2022    2:39 PM 10/16/2021    3:16 PM 06/05/2021    2:01 PM  Depression screen PHQ 2/9  Decreased Interest 0 0 0 0 0  Down, Depressed, Hopeless 0 0 0 0 0  PHQ - 2 Score 0 0 0 0 0  Altered sleeping 0 0     Tired, decreased energy 0 0     Change in appetite 0 0     Feeling bad or failure about yourself  0 0     Trouble concentrating 0 0     Moving slowly or fidgety/restless 0 0     Suicidal thoughts 0 0     PHQ-9 Score 0 0     Difficult doing work/chores Not difficult at all Not difficult at all           07/29/2023    3:16 PM  Fall Risk   Falls in the past year? 0  Number falls in past yr: 0  Injury with Fall? 0  Risk for fall due to : No Fall Risks    Patient Care Team: Mercy Stall, MD as PCP - General (Family Medicine) Misenheimer, Emeterio Hansen, MD as Consulting Physician (Unknown Physician Specialty)   Review of Systems  Constitutional:  Negative for chills, fatigue and fever.  HENT:  Negative for congestion, ear pain and sore throat.   Respiratory:  Negative for cough and shortness of breath.   Cardiovascular:  Negative for chest pain.  Gastrointestinal:   Negative for abdominal pain, constipation, diarrhea, nausea and vomiting.  Endocrine: Negative for polydipsia, polyphagia and polyuria.  Genitourinary:  Negative for dysuria and frequency.  Musculoskeletal:  Negative for arthralgias and myalgias.  Neurological:  Negative for dizziness and headaches.  Psychiatric/Behavioral:  Negative for dysphoric mood.        No dysphoria    Current Outpatient Medications on File Prior to Visit  Medication Sig Dispense Refill   amLODipine  (NORVASC ) 5 MG tablet Take 1 tablet by mouth once daily 90 tablet 0   cyclobenzaprine  (FLEXERIL ) 10 MG tablet Take 1 tablet (10 mg total) by mouth 3 (three) times daily as needed. for muscle spams 270 tablet 1   Evolocumab  (REPATHA  SURECLICK) 140 MG/ML SOAJ Inject 140 mg into the skin every 14 (fourteen) days. 6 mL 1   ezetimibe  (ZETIA ) 10 MG tablet Take 1 tablet by mouth once daily 90 tablet 0   Krill Oil 500 MG CAPS Take 500 mg by mouth daily.     naltrexone  (DEPADE) 50 MG tablet Take 1 tablet (50 mg total) by mouth daily. 90 tablet 1  olmesartan  (BENICAR ) 40 MG tablet Take 1 tablet (40 mg total) by mouth daily. 90 tablet 0   zolpidem  (AMBIEN ) 10 MG tablet TAKE 1 TABLET BY MOUTH AT BEDTIME 90 tablet 0   No current facility-administered medications on file prior to visit.   Past Medical History:  Diagnosis Date   Acute hepatitis B without delta-agent and without hepatic coma    Essential hypertension    Mixed hyperlipidemia    Primary insomnia    Testicular hypofunction    Past Surgical History:  Procedure Laterality Date   HERNIA REPAIR      Family History  Problem Relation Age of Onset   Hyperlipidemia Mother    Diabetes Mother    Hyperlipidemia Father    Social History   Socioeconomic History   Marital status: Divorced    Spouse name: Not on file   Number of children: 1   Years of education: Not on file   Highest education level: Not on file  Occupational History   Occupation: Delivery Driver     Comment: [employer]  Tobacco Use   Smoking status: Never   Smokeless tobacco: Never  Vaping Use   Vaping status: Never Used  Substance and Sexual Activity   Alcohol use: Yes    Alcohol/week: 35.0 standard drinks of alcohol    Types: 35 Cans of beer per week    Comment: Alcoholic, currently not in treatment, drink 4-5 cans of beer daily, some more   Drug use: Never   Sexual activity: Not Currently    Partners: Female  Other Topics Concern   Not on file  Social History Narrative   Not on file   Social Drivers of Health   Financial Resource Strain: Low Risk  (10/22/2022)   Overall Financial Resource Strain (CARDIA)    Difficulty of Paying Living Expenses: Not hard at all  Food Insecurity: No Food Insecurity (10/22/2022)   Hunger Vital Sign    Worried About Running Out of Food in the Last Year: Never true    Ran Out of Food in the Last Year: Never true  Transportation Needs: No Transportation Needs (10/22/2022)   PRAPARE - Administrator, Civil Service (Medical): No    Lack of Transportation (Non-Medical): No  Physical Activity: Insufficiently Active (10/16/2021)   Exercise Vital Sign    Days of Exercise per Week: 2 days    Minutes of Exercise per Session: 20 min  Stress: No Stress Concern Present (10/22/2022)   [employer] of Occupational Health - Occupational Stress Questionnaire    Feeling of Stress : Not at all  Social Connections: Socially Isolated (10/22/2022)   Social Connection and Isolation Panel [NHANES]    Frequency of Communication with Friends and Family: More than three times a week    Frequency of Social Gatherings with Friends and Family: More than three times a week    Attends Religious Services: Never    Database administrator or Organizations: No    Attends Banker Meetings: Never    Marital Status: Divorced    Objective:  There were no vitals taken for this visit.     09/23/2023    8:30 AM 07/29/2023    3:27 PM 07/29/2023     3:13 PM  BP/Weight  Systolic BP 130 182 142  Diastolic BP 80 108 110  Wt. (Lbs) 244.8  244  BMI 33.2 kg/m2  33.09 kg/m2    Physical Exam Vitals reviewed.  Constitutional:  Appearance: Normal appearance.  Neck:     Vascular: No carotid bruit.  Cardiovascular:     Rate and Rhythm: Normal rate and regular rhythm.     Pulses: Normal pulses.     Heart sounds: Normal heart sounds.  Pulmonary:     Effort: Pulmonary effort is normal.     Breath sounds: Normal breath sounds. No wheezing, rhonchi or rales.  Abdominal:     General: Bowel sounds are normal.     Palpations: Abdomen is soft.     Tenderness: There is no abdominal tenderness.  Neurological:     Mental Status: He is alert and oriented to person, place, and time.  Psychiatric:        Mood and Affect: Mood normal.        Behavior: Behavior normal.     Diabetic Foot Exam - Simple   No data filed      Lab Results  Component Value Date   WBC 6.3 06/26/2023   HGB 16.1 06/26/2023   HCT 49.4 06/26/2023   PLT 174 06/26/2023   GLUCOSE 90 06/26/2023   CHOL 262 (H) 06/26/2023   TRIG 367 (H) 06/26/2023   HDL 45 06/26/2023   LDLCALC 149 (H) 06/26/2023   ALT 344 (H) 06/26/2023   AST 158 (H) 06/26/2023   NA 140 06/26/2023   K 5.4 (H) 06/26/2023   CL 99 06/26/2023   CREATININE 0.98 06/26/2023   BUN 16 06/26/2023   CO2 23 06/26/2023   TSH 3.400 04/16/2023      Assessment & Plan:  Mixed hyperlipidemia  Essential hypertension  Elevated liver enzymes  Testicular hypofunction  Polycythemia, secondary  Nonalcoholic steatohepatitis (NASH)  Other insomnia     No orders of the defined types were placed in this encounter.   No orders of the defined types were placed in this encounter.    Follow-up: No follow-ups on file.   I,Rudy Domek,acting as a Neurosurgeon for Mercy Stall, MD.,have documented all relevant documentation on the behalf of Mercy Stall, MD,as directed by  Mercy Stall, MD while in the  presence of Mercy Stall, MD.   An After Visit Summary was printed and given to the patient.  Mercy Stall, MD [facility] [phone]

## 2023-12-04 ENCOUNTER — Ambulatory Visit (INDEPENDENT_AMBULATORY_CARE_PROVIDER_SITE_OTHER): Admitting: Family Medicine

## 2023-12-04 VITALS — BP 148/100 | HR 104 | Temp 98.3°F | Ht 73.8 in | Wt 235.0 lb

## 2023-12-04 DIAGNOSIS — K7581 Nonalcoholic steatohepatitis (NASH): Secondary | ICD-10-CM

## 2023-12-04 DIAGNOSIS — E782 Mixed hyperlipidemia: Secondary | ICD-10-CM

## 2023-12-04 DIAGNOSIS — G4709 Other insomnia: Secondary | ICD-10-CM

## 2023-12-04 DIAGNOSIS — R748 Abnormal levels of other serum enzymes: Secondary | ICD-10-CM | POA: Diagnosis not present

## 2023-12-04 DIAGNOSIS — F1090 Alcohol use, unspecified, uncomplicated: Secondary | ICD-10-CM

## 2023-12-04 DIAGNOSIS — I10 Essential (primary) hypertension: Secondary | ICD-10-CM | POA: Diagnosis not present

## 2023-12-04 DIAGNOSIS — D751 Secondary polycythemia: Secondary | ICD-10-CM

## 2023-12-04 DIAGNOSIS — E291 Testicular hypofunction: Secondary | ICD-10-CM

## 2023-12-04 MED ORDER — AMLODIPINE BESYLATE 10 MG PO TABS: 10.0000 mg | ORAL_TABLET | Freq: Every day | ORAL | 0 refills | Status: DC

## 2023-12-04 MED ORDER — OLMESARTAN MEDOXOMIL 40 MG PO TABS: 40.0000 mg | ORAL_TABLET | Freq: Every day | ORAL | 0 refills | Status: DC

## 2023-12-04 MED ORDER — NALTREXONE HCL 50 MG PO TABS: 50.0000 mg | ORAL_TABLET | Freq: Every day | ORAL | 1 refills | Status: DC

## 2023-12-04 MED ORDER — REPATHA SURECLICK 140 MG/ML ~~LOC~~ SOAJ: 1.0000 mL | SUBCUTANEOUS | 1 refills | Status: DC

## 2023-12-04 MED ORDER — ZOLPIDEM TARTRATE 10 MG PO TABS: 10.0000 mg | ORAL_TABLET | Freq: Every day | ORAL | 0 refills | Status: DC

## 2023-12-04 MED ORDER — CYCLOBENZAPRINE HCL 10 MG PO TABS: 10.0000 mg | ORAL_TABLET | Freq: Three times a day (TID) | ORAL | 1 refills | Status: AC | PRN

## 2023-12-04 MED ORDER — EZETIMIBE 10 MG PO TABS
10.0000 mg | ORAL_TABLET | Freq: Every day | ORAL | 0 refills | Status: DC
Start: 2023-12-04 — End: 2024-02-28

## 2023-12-04 NOTE — Assessment & Plan Note (Signed)
Well controlled.  No changes to medicines. Continue zetia and repatha Continue to work on eating a healthy diet and exercise.  Labs drawn today.

## 2023-12-04 NOTE — Assessment & Plan Note (Signed)
 David Murphy

## 2023-12-04 NOTE — Assessment & Plan Note (Signed)
 Continue naltrexone  50 mg daily.  Recommend decrease alcohol.

## 2023-12-04 NOTE — Assessment & Plan Note (Signed)
 Check labs. Recommend stop alcohol intake.

## 2023-12-04 NOTE — Assessment & Plan Note (Signed)
The current medical regimen is effective;  continue present plan and medications. Zolpidem 10 mg at bedtime.

## 2023-12-05 ENCOUNTER — Encounter: Payer: Self-pay | Admitting: Family Medicine

## 2023-12-05 LAB — CBC WITH DIFFERENTIAL/PLATELET
Basophils Absolute: 0.1 10*3/uL (ref 0.0–0.2)
Basos: 1 %
EOS (ABSOLUTE): 0.2 10*3/uL (ref 0.0–0.4)
Eos: 3 %
Hematocrit: 45.9 % (ref 37.5–51.0)
Hemoglobin: 15.5 g/dL (ref 13.0–17.7)
Immature Grans (Abs): 0 10*3/uL (ref 0.0–0.1)
Immature Granulocytes: 1 %
Lymphocytes Absolute: 1.5 10*3/uL (ref 0.7–3.1)
Lymphs: 21 %
MCH: 30.9 pg (ref 26.6–33.0)
MCHC: 33.8 g/dL (ref 31.5–35.7)
MCV: 92 fL (ref 79–97)
Monocytes Absolute: 0.5 10*3/uL (ref 0.1–0.9)
Monocytes: 8 %
Neutrophils Absolute: 4.6 10*3/uL (ref 1.4–7.0)
Neutrophils: 66 %
Platelets: 216 10*3/uL (ref 150–450)
RBC: 5.01 x10E6/uL (ref 4.14–5.80)
RDW: 12.1 % (ref 11.6–15.4)
WBC: 6.8 10*3/uL (ref 3.4–10.8)

## 2023-12-05 LAB — COMPREHENSIVE METABOLIC PANEL WITH GFR
ALT: 209 IU/L (ref 0–44)
AST: 116 IU/L — ABNORMAL HIGH (ref 0–40)
Albumin: 4.9 g/dL (ref 3.8–4.9)
Alkaline Phosphatase: 108 IU/L (ref 44–121)
BUN/Creatinine Ratio: 7 — ABNORMAL LOW (ref 9–20)
BUN: 12 mg/dL (ref 6–24)
Bilirubin Total: 0.7 mg/dL (ref 0.0–1.2)
CO2: 22 mmol/L (ref 20–29)
Calcium: 9.9 mg/dL (ref 8.7–10.2)
Chloride: 100 mmol/L (ref 96–106)
Creatinine, Ser: 1.66 mg/dL — ABNORMAL HIGH (ref 0.76–1.27)
Globulin, Total: 2.3 g/dL (ref 1.5–4.5)
Glucose: 89 mg/dL (ref 70–99)
Potassium: 4.5 mmol/L (ref 3.5–5.2)
Sodium: 140 mmol/L (ref 134–144)
Total Protein: 7.2 g/dL (ref 6.0–8.5)
eGFR: 50 mL/min/{1.73_m2} — ABNORMAL LOW (ref >59)

## 2023-12-05 LAB — LIPID PANEL
Chol/HDL Ratio: 2.4 ratio (ref 0.0–5.0)
Cholesterol, Total: 134 mg/dL (ref 100–199)
HDL: 57 mg/dL (ref >39)
LDL Chol Calc (NIH): 55 mg/dL (ref 0–99)
Triglycerides: 123 mg/dL (ref 0–149)
VLDL Cholesterol Cal: 22 mg/dL (ref 5–40)

## 2023-12-09 ENCOUNTER — Other Ambulatory Visit

## 2023-12-09 ENCOUNTER — Other Ambulatory Visit: Payer: Self-pay

## 2023-12-09 DIAGNOSIS — R899 Unspecified abnormal finding in specimens from other organs, systems and tissues: Secondary | ICD-10-CM

## 2023-12-10 LAB — COMPREHENSIVE METABOLIC PANEL WITH GFR
ALT: 163 IU/L — ABNORMAL HIGH (ref 0–44)
AST: 80 IU/L — ABNORMAL HIGH (ref 0–40)
Albumin: 4.8 g/dL (ref 3.8–4.9)
Alkaline Phosphatase: 105 IU/L (ref 44–121)
BUN/Creatinine Ratio: 12 (ref 9–20)
BUN: 12 mg/dL (ref 6–24)
Bilirubin Total: 0.5 mg/dL (ref 0.0–1.2)
CO2: 24 mmol/L (ref 20–29)
Calcium: 9.6 mg/dL (ref 8.7–10.2)
Chloride: 103 mmol/L (ref 96–106)
Creatinine, Ser: 1.01 mg/dL (ref 0.76–1.27)
Globulin, Total: 2.1 g/dL (ref 1.5–4.5)
Glucose: 107 mg/dL — ABNORMAL HIGH (ref 70–99)
Potassium: 4 mmol/L (ref 3.5–5.2)
Sodium: 142 mmol/L (ref 134–144)
Total Protein: 6.9 g/dL (ref 6.0–8.5)
eGFR: 90 mL/min/{1.73_m2} (ref >59)

## 2023-12-18 ENCOUNTER — Encounter (HOSPITAL_COMMUNITY): Payer: Self-pay

## 2023-12-25 ENCOUNTER — Encounter: Payer: Self-pay | Admitting: Family Medicine

## 2023-12-25 ENCOUNTER — Ambulatory Visit (INDEPENDENT_AMBULATORY_CARE_PROVIDER_SITE_OTHER): Admitting: Family Medicine

## 2023-12-25 VITALS — BP 130/62 | HR 92 | Temp 98.2°F | Resp 16 | Ht 61.75 in | Wt 234.8 lb

## 2023-12-25 DIAGNOSIS — R600 Localized edema: Secondary | ICD-10-CM | POA: Diagnosis not present

## 2023-12-25 DIAGNOSIS — L270 Generalized skin eruption due to drugs and medicaments taken internally: Secondary | ICD-10-CM | POA: Diagnosis not present

## 2023-12-25 DIAGNOSIS — R000 Tachycardia, unspecified: Secondary | ICD-10-CM | POA: Diagnosis not present

## 2023-12-25 DIAGNOSIS — I10 Essential (primary) hypertension: Secondary | ICD-10-CM

## 2023-12-25 MED ORDER — CHLORTHALIDONE 50 MG PO TABS: 50.0000 mg | ORAL_TABLET | Freq: Every day | ORAL | 0 refills | Status: DC

## 2023-12-25 NOTE — Assessment & Plan Note (Signed)
 Edema due to increased amlodipine  dosage. Managed with chlorthalidone. - Prescribe chlorthalidone 50 mg once daily in the morning. - Advise to monitor for signs of dehydration and replenish fluids as needed. - Follow up in two weeks to assess edema and blood pressure.

## 2023-12-25 NOTE — Progress Notes (Signed)
 Subjective:  Patient ID: David Murphy, male    DOB: 28-Apr-1971  Age: 53 y.o. MRN: [id number]  Chief Complaint  Patient presents with   Rash   Leg Swelling    Discussed the use of AI scribe software for clinical note transcription with the patient, who gave verbal consent to proceed.  History of Present Illness   David Murphy is a 53 year old male with hypertension who presents with leg swelling and a rash after medication changes.  He has experienced leg swelling localized to the top of his feet, which is a new symptom for him, following an increase in his amlodipine  dosage from 5 mg to 10 mg. He experienced pain over the weekend. The rash is also new, and he recognized it due to a similar issue his girlfriend had in the past. He has been applying lotion to the rash, which is not painful or raised.  His medication regimen was recently altered, with lisinopril  being discontinued and olmesartan  initiated. He is currently taking amlodipine  10 mg, olmesartan  40 mg, and has some remaining amlodipine  5 mg tablets. He suspects he may have accidentally taken 15 mg of amlodipine  due to confusion with medication bottles, which could have contributed to his symptoms.  He is curious if olmesartan  could be contributing to his symptoms, as he researched potential side effects online. He suspects olmesartan  might be causing back pain, though he is uncertain.  No chest pain, shortness of breath, or symptoms associated with a fast heart rate. His potassium levels were recently checked and were normal.          12/04/2023    2:28 PM 07/29/2023    3:17 PM 04/16/2023    2:49 PM 10/22/2022    2:39 PM 10/16/2021    3:16 PM  Depression screen PHQ 2/9  Decreased Interest 0 0 0 0 0  Down, Depressed, Hopeless 0 0 0 0 0  PHQ - 2 Score 0 0 0 0 0  Altered sleeping  0 0    Tired, decreased energy  0 0    Change in appetite  0 0    Feeling bad or failure about yourself   0 0    Trouble concentrating  0 0     Moving slowly or fidgety/restless  0 0    Suicidal thoughts  0 0    PHQ-9 Score  0 0    Difficult doing work/chores  Not difficult at all Not difficult at all          07/29/2023    3:16 PM  Fall Risk   Falls in the past year? 0  Number falls in past yr: 0  Injury with Fall? 0  Risk for fall due to : No Fall Risks    Patient Care Team: Mercy Stall, MD as PCP - General (Family Medicine) Misenheimer, Emeterio Hansen, MD as Consulting Physician (Unknown Physician Specialty)   Review of Systems  Constitutional:  Positive for fatigue. Negative for appetite change and fever.  HENT:  Negative for congestion, ear pain, sinus pressure and sore throat.   Eyes: Negative.   Respiratory:  Negative for cough, chest tightness, shortness of breath and wheezing.   Cardiovascular:  Positive for leg swelling. Negative for chest pain and palpitations.  Gastrointestinal:  Negative for abdominal pain, constipation, diarrhea, nausea and vomiting.  Endocrine: Negative.   Genitourinary:  Negative for dysuria and hematuria.  Musculoskeletal:  Negative for arthralgias, back pain, joint swelling and myalgias.  Skin:  Positive  for rash.  Allergic/Immunologic: Negative.   Neurological:  Negative for dizziness, weakness and headaches.  Hematological: Negative.   Psychiatric/Behavioral:  Negative for dysphoric mood. The patient is not nervous/anxious.     Current Outpatient Medications on File Prior to Visit  Medication Sig Dispense Refill   amLODipine  (NORVASC ) 10 MG tablet Take 1 tablet (10 mg total) by mouth daily. 90 tablet 0   cyclobenzaprine  (FLEXERIL ) 10 MG tablet Take 1 tablet (10 mg total) by mouth 3 (three) times daily as needed. for muscle spams 270 tablet 1   Evolocumab  (REPATHA  SURECLICK) 140 MG/ML SOAJ Inject 140 mg into the skin every 14 (fourteen) days. 6 mL 1   ezetimibe  (ZETIA ) 10 MG tablet Take 1 tablet (10 mg total) by mouth daily. 90 tablet 0   Krill Oil 500 MG CAPS Take 500 mg by mouth  daily.     naltrexone  (DEPADE) 50 MG tablet Take 1 tablet (50 mg total) by mouth daily. 90 tablet 1   olmesartan  (BENICAR ) 40 MG tablet Take 1 tablet (40 mg total) by mouth daily. 90 tablet 0   zolpidem  (AMBIEN ) 10 MG tablet Take 1 tablet (10 mg total) by mouth at bedtime. 90 tablet 0   No current facility-administered medications on file prior to visit.   Past Medical History:  Diagnosis Date   Acute hepatitis B without delta-agent and without hepatic coma    Essential hypertension    Mixed hyperlipidemia    Primary insomnia    Testicular hypofunction    Past Surgical History:  Procedure Laterality Date   HERNIA REPAIR      Family History  Problem Relation Age of Onset   Hyperlipidemia Mother    Diabetes Mother    Hyperlipidemia Father    Social History   Socioeconomic History   Marital status: Divorced    Spouse name: Not on file   Number of children: 1   Years of education: Not on file   Highest education level: Not on file  Occupational History   Occupation: Delivery Driver    Comment: [employer]  Tobacco Use   Smoking status: Never   Smokeless tobacco: Never  Vaping Use   Vaping status: Never Used  Substance and Sexual Activity   Alcohol use: Yes    Alcohol/week: 35.0 standard drinks of alcohol    Types: 35 Cans of beer per week    Comment: Alcoholic, currently not in treatment, drink 4-5 cans of beer daily, some more   Drug use: Never   Sexual activity: Not Currently    Partners: Female  Other Topics Concern   Not on file  Social History Narrative   Not on file   Social Drivers of Health   Financial Resource Strain: Low Risk  (12/04/2023)   Overall Financial Resource Strain (CARDIA)    Difficulty of Paying Living Expenses: Not hard at all  Food Insecurity: No Food Insecurity (12/04/2023)   Hunger Vital Sign    Worried About Running Out of Food in the Last Year: Never true    Ran Out of Food in the Last Year: Never true  Transportation Needs: No  Transportation Needs (12/04/2023)   PRAPARE - Administrator, Civil Service (Medical): No    Lack of Transportation (Non-Medical): No  Physical Activity: Inactive (12/04/2023)   Exercise Vital Sign    Days of Exercise per Week: 0 days    Minutes of Exercise per Session: 0 min  Stress: No Stress Concern Present (12/04/2023)  [employer] of Occupational Health - Occupational Stress Questionnaire    Feeling of Stress : Not at all  Social Connections: Socially Isolated (12/04/2023)   Social Connection and Isolation Panel [NHANES]    Frequency of Communication with Friends and Family: More than three times a week    Frequency of Social Gatherings with Friends and Family: More than three times a week    Attends Religious Services: Never    Database administrator or Organizations: No    Attends Engineer, structural: Never    Marital Status: Divorced    Objective:  BP 130/62   Pulse 92   Temp 98.2 F (36.8 C) (Temporal)   Resp 16   Ht 5' 1.75" (1.568 m)   Wt 234 lb 12.8 oz (106.5 kg)   SpO2 96%   BMI 43.29 kg/m      12/25/2023    2:20 PM 12/04/2023    2:26 PM 09/23/2023    8:30 AM  BP/Weight  Systolic BP 130 148 130  Diastolic BP 62 100 80  Wt. (Lbs) 234.8 235 244.8  BMI 43.29 kg/m2 30.34 kg/m2 33.2 kg/m2    Physical Exam Vitals reviewed.  Constitutional:      General: He is not in acute distress.    Appearance: Normal appearance.  Eyes:     Conjunctiva/sclera: Conjunctivae normal.  Cardiovascular:     Rate and Rhythm: Normal rate and regular rhythm.     Heart sounds: Normal heart sounds. No murmur heard. Pulmonary:     Effort: Pulmonary effort is normal.     Breath sounds: Normal breath sounds. No wheezing.  Abdominal:     General: Bowel sounds are normal.     Palpations: Abdomen is soft.     Tenderness: There is no abdominal tenderness.  Skin:    General: Skin is warm.     Findings: Rash present.  Neurological:     Mental Status: He is  alert. Mental status is at baseline.  Psychiatric:        Mood and Affect: Mood normal.        Behavior: Behavior normal.        Lab Results  Component Value Date   WBC 6.8 12/04/2023   HGB 15.5 12/04/2023   HCT 45.9 12/04/2023   PLT 216 12/04/2023   GLUCOSE 107 (H) 12/09/2023   CHOL 134 12/04/2023   TRIG 123 12/04/2023   HDL 57 12/04/2023   LDLCALC 55 12/04/2023   ALT 163 (H) 12/09/2023   AST 80 (H) 12/09/2023   NA 142 12/09/2023   K 4.0 12/09/2023   CL 103 12/09/2023   CREATININE 1.01 12/09/2023   BUN 12 12/09/2023   CO2 24 12/09/2023   TSH 3.400 04/16/2023      Assessment & Plan:  Pedal edema Assessment & Plan: Edema due to increased amlodipine  dosage. Managed with chlorthalidone. - Prescribe chlorthalidone 50 mg once daily in the morning. - Advise to monitor for signs of dehydration and replenish fluids as needed. - Follow up in two weeks to assess edema and blood pressure.  Orders: -     Chlorthalidone; Take 1 tablet (50 mg total) by mouth daily.  Dispense: 30 tablet; Refill: 0  Essential hypertension Assessment & Plan: Hypertension controlled with current regimen. Amlodipine  reduced due to side effects. Olmesartan  continued. - Reduce amlodipine  to 5 mg daily. - Continue olmesartan  at 40 mg daily. - Follow up in two weeks to assess blood pressure and medication effects.  Drug rash Assessment & Plan: Rash likely from amlodipine , expected to improve with dosage reduction. - Monitor rash for improvement with dosage adjustment.   Tachycardia, unspecified Assessment & Plan: Asymptomatic tachycardia, possibly anxiety-related. No treatment unless symptoms develop. Pulse Readings from Last 3 Encounters:  12/25/23 92  12/04/23 (!) 104  09/23/23 (!) 111    - Monitor heart rate and symptoms.       Meds ordered this encounter  Medications   chlorthalidone (HYGROTON) 50 MG tablet    Sig: Take 1 tablet (50 mg total) by mouth daily.    Dispense:  30  tablet    Refill:  0    No orders of the defined types were placed in this encounter.    Follow-up: Return in about 2 weeks (around 01/08/2024) for BP recheck, med check.    An After Visit Summary was printed and given to the patient.  Delford Felling, FNP [facility] [phone]

## 2023-12-25 NOTE — Assessment & Plan Note (Addendum)
 Asymptomatic tachycardia, possibly anxiety-related. No treatment unless symptoms develop. Pulse Readings from Last 3 Encounters:  12/25/23 92  12/04/23 (!) 104  09/23/23 (!) 111    - Monitor heart rate and symptoms.

## 2023-12-25 NOTE — Assessment & Plan Note (Signed)
 Hypertension controlled with current regimen. Amlodipine  reduced due to side effects. Olmesartan  continued. - Reduce amlodipine  to 5 mg daily. - Continue olmesartan  at 40 mg daily. - Follow up in two weeks to assess blood pressure and medication effects.

## 2023-12-25 NOTE — Assessment & Plan Note (Signed)
 Rash likely from amlodipine , expected to improve with dosage reduction. - Monitor rash for improvement with dosage adjustment.

## 2024-01-08 ENCOUNTER — Encounter: Admitting: Family Medicine

## 2024-01-08 NOTE — Progress Notes (Unsigned)
 Subjective:  Patient ID: David Murphy, male    DOB: January 27, 1971  Age: 53 y.o. MRN: [id number]  Chief Complaint  Patient presents with   Hypertension   Medical Management of Chronic Issues    HPI:   HPI:  Hyperlipidemia: Patient is taking Zetia  10 mg daily, Krill Oil 500mg  one twice a day, repatha  every 14 day.   Hypertension: He takes olmesartan  40 mg daily and amlodipine  5 mg daily.   Testicular Hypofunction: Testosterone  1.62% gel apply 3 pumps topically once a day to clean, dry, intact skin. Does not seem if it is helping.     Insomnia: Zolpidem  10 mg daily at bedtime.    History of elevated LFTs. Hep panel has been negative. 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 ON naltrexone  50 mg daily. 6 pack beer per day. This is less than previously.       07/29/2023    3:16 PM  Fall Risk   Falls in the past year? 0  Number falls in past yr: 0  Injury with Fall? 0  Risk for fall due to : No Fall Risks    Patient Care Team: Mercy Stall, MD as PCP - General (Family Medicine) Misenheimer, Emeterio Hansen, MD as Consulting Physician (Unknown Physician Specialty)   Review of Systems  Current Outpatient Medications on File Prior to Visit  Medication Sig Dispense Refill   amLODipine  (NORVASC ) 10 MG tablet Take 1 tablet (10 mg total) by mouth daily. 90 tablet 0   chlorthalidone  (HYGROTON ) 50 MG tablet Take 1 tablet (50 mg total) by mouth daily. 30 tablet 0   cyclobenzaprine  (FLEXERIL ) 10 MG tablet Take 1 tablet (10 mg total) by mouth 3 (three) times daily as needed. for muscle spams 270 tablet 1   Evolocumab  (REPATHA  SURECLICK) 140 MG/ML SOAJ Inject 140 mg into the skin every 14 (fourteen) days. 6 mL 1   ezetimibe  (ZETIA ) 10 MG tablet Take 1 tablet (10 mg total) by mouth daily. 90 tablet 0   Krill Oil 500 MG CAPS Take 500 mg by mouth daily.     naltrexone  (DEPADE) 50 MG tablet Take 1 tablet (50  mg total) by mouth daily. 90 tablet 1   olmesartan  (BENICAR ) 40 MG tablet Take 1 tablet (40 mg total) by mouth daily. 90 tablet 0   zolpidem  (AMBIEN ) 10 MG tablet Take 1 tablet (10 mg total) by mouth at bedtime. 90 tablet 0   No current facility-administered medications on file prior to visit.   Past Medical History:  Diagnosis Date   Acute hepatitis B without delta-agent and without hepatic coma    Essential hypertension    Mixed hyperlipidemia    Primary insomnia    Testicular hypofunction    Past Surgical History:  Procedure Laterality Date   HERNIA REPAIR      Family History  Problem Relation Age of Onset   Hyperlipidemia Mother    Diabetes Mother    Hyperlipidemia Father    Social History   Socioeconomic History   Marital status: Divorced    Spouse name: Not on file   Number of children: 1   Years of education: Not on file   Highest education level: Not on file  Occupational History   Occupation: Delivery Driver    Comment: [employer]  Tobacco Use   Smoking status: Never   Smokeless tobacco: Never  Vaping Use   Vaping status: Never Used  Substance and  Sexual Activity   Alcohol use: Yes    Alcohol/week: 35.0 standard drinks of alcohol    Types: 35 Cans of beer per week    Comment: Alcoholic, currently not in treatment, drink 4-5 cans of beer daily, some more   Drug use: Never   Sexual activity: Not Currently    Partners: Female  Other Topics Concern   Not on file  Social History Narrative   Not on file   Social Drivers of Health   Financial Resource Strain: Low Risk  (12/04/2023)   Overall Financial Resource Strain (CARDIA)    Difficulty of Paying Living Expenses: Not hard at all  Food Insecurity: No Food Insecurity (12/04/2023)   Hunger Vital Sign    Worried About Running Out of Food in the Last Year: Never true    Ran Out of Food in the Last Year: Never true  Transportation Needs: No Transportation Needs (12/04/2023)   PRAPARE - Doctor, general practice (Medical): No    Lack of Transportation (Non-Medical): No  Physical Activity: Inactive (12/04/2023)   Exercise Vital Sign    Days of Exercise per Week: 0 days    Minutes of Exercise per Session: 0 min  Stress: No Stress Concern Present (12/04/2023)   [employer] of Occupational Health - Occupational Stress Questionnaire    Feeling of Stress : Not at all  Social Connections: Socially Isolated (12/04/2023)   Social Connection and Isolation Panel [NHANES]    Frequency of Communication with Friends and Family: More than three times a week    Frequency of Social Gatherings with Friends and Family: More than three times a week    Attends Religious Services: Never    Database administrator or Organizations: No    Attends Banker Meetings: Never    Marital Status: Divorced    Objective:  There were no vitals taken for this visit.     12/25/2023    2:20 PM 12/04/2023    2:26 PM 09/23/2023    8:30 AM  BP/Weight  Systolic BP 130 148 130  Diastolic BP 62 100 80  Wt. (Lbs) 234.8 235 244.8  BMI 43.29 kg/m2 30.34 kg/m2 33.2 kg/m2    Physical Exam  Diabetic Foot Exam - Simple   No data filed      Lab Results  Component Value Date   WBC 6.8 12/04/2023   HGB 15.5 12/04/2023   HCT 45.9 12/04/2023   PLT 216 12/04/2023   GLUCOSE 107 (H) 12/09/2023   CHOL 134 12/04/2023   TRIG 123 12/04/2023   HDL 57 12/04/2023   LDLCALC 55 12/04/2023   ALT 163 (H) 12/09/2023   AST 80 (H) 12/09/2023   NA 142 12/09/2023   K 4.0 12/09/2023   CL 103 12/09/2023   CREATININE 1.01 12/09/2023   BUN 12 12/09/2023   CO2 24 12/09/2023   TSH 3.400 04/16/2023      Assessment & Plan:  There are no diagnoses linked to this encounter.   No orders of the defined types were placed in this encounter.   No orders of the defined types were placed in this encounter.    Follow-up: No follow-ups on file.   I,Angela Taylor,acting as a Neurosurgeon for Janece Means, FNP.,have  documented all relevant documentation on the behalf of Janece Means, FNP,as directed by  Janece Means, FNP while in the presence of Janece Means, FNP.   An After Visit Summary was printed  and given to the patient.  Janece Means, FNP [facility] [phone]

## 2024-01-09 ENCOUNTER — Encounter: Payer: Self-pay | Admitting: Family Medicine

## 2024-01-09 NOTE — Progress Notes (Signed)
 This encounter was created in error - please disregard.

## 2024-01-18 ENCOUNTER — Other Ambulatory Visit: Payer: Self-pay | Admitting: Family Medicine

## 2024-01-18 DIAGNOSIS — I10 Essential (primary) hypertension: Secondary | ICD-10-CM

## 2024-01-31 ENCOUNTER — Other Ambulatory Visit: Payer: Self-pay | Admitting: Family Medicine

## 2024-01-31 DIAGNOSIS — E291 Testicular hypofunction: Secondary | ICD-10-CM

## 2024-01-31 DIAGNOSIS — I10 Essential (primary) hypertension: Secondary | ICD-10-CM

## 2024-02-18 ENCOUNTER — Telehealth: Payer: Self-pay

## 2024-02-18 NOTE — Telephone Encounter (Signed)
 Received Prior Authorization requests from [facility] in [place] for Repatha  sure click 140 mg. I called to inform them that his PA was approved until 03/2024. They WERE able to fill for one more month because the Maximum amount to be filled in 84 days will be reached after this last month of refill.

## 2024-02-19 ENCOUNTER — Other Ambulatory Visit: Payer: Self-pay | Admitting: Family Medicine

## 2024-02-19 DIAGNOSIS — E782 Mixed hyperlipidemia: Secondary | ICD-10-CM

## 2024-02-19 DIAGNOSIS — R748 Abnormal levels of other serum enzymes: Secondary | ICD-10-CM

## 2024-02-19 MED ORDER — REPATHA SURECLICK 140 MG/ML ~~LOC~~ SOAJ: 1.0000 mL | SUBCUTANEOUS | 1 refills | Status: DC

## 2024-02-28 ENCOUNTER — Encounter: Payer: Self-pay | Admitting: Advanced Practice Midwife

## 2024-02-28 ENCOUNTER — Other Ambulatory Visit: Payer: Self-pay | Admitting: Family Medicine

## 2024-02-28 DIAGNOSIS — E782 Mixed hyperlipidemia: Secondary | ICD-10-CM

## 2024-03-02 ENCOUNTER — Other Ambulatory Visit: Payer: Self-pay

## 2024-03-11 ENCOUNTER — Ambulatory Visit (INDEPENDENT_AMBULATORY_CARE_PROVIDER_SITE_OTHER): Admitting: Family Medicine

## 2024-03-11 ENCOUNTER — Encounter: Payer: Self-pay | Admitting: Family Medicine

## 2024-03-11 VITALS — BP 124/76 | HR 101 | Temp 98.1°F | Ht 72.0 in | Wt 234.0 lb

## 2024-03-11 DIAGNOSIS — I10 Essential (primary) hypertension: Secondary | ICD-10-CM | POA: Diagnosis not present

## 2024-03-11 DIAGNOSIS — F1090 Alcohol use, unspecified, uncomplicated: Secondary | ICD-10-CM | POA: Diagnosis not present

## 2024-03-11 DIAGNOSIS — R748 Abnormal levels of other serum enzymes: Secondary | ICD-10-CM

## 2024-03-11 DIAGNOSIS — G4709 Other insomnia: Secondary | ICD-10-CM

## 2024-03-11 DIAGNOSIS — D171 Benign lipomatous neoplasm of skin and subcutaneous tissue of trunk: Secondary | ICD-10-CM

## 2024-03-11 DIAGNOSIS — R899 Unspecified abnormal finding in specimens from other organs, systems and tissues: Secondary | ICD-10-CM

## 2024-03-11 DIAGNOSIS — E782 Mixed hyperlipidemia: Secondary | ICD-10-CM | POA: Diagnosis not present

## 2024-03-11 MED ORDER — DILTIAZEM HCL ER COATED BEADS 120 MG PO CP24: 120.0000 mg | ORAL_CAPSULE | Freq: Every day | ORAL | 2 refills | Status: DC

## 2024-03-11 NOTE — Patient Instructions (Signed)
 VISIT SUMMARY:  During your visit, we discussed your foot swelling, blood pressure management, cholesterol treatment, alcohol use, sleep issues, and a skin lesion on your rib cage.  YOUR PLAN:  PERIPHERAL EDEMA: Your foot swelling is likely due to your blood pressure medication, amlodipine . -Discontinue amlodipine  after your [place]  trip. -Start taking diltiazem  120 mg daily after your trip. -Monitor your blood pressure at home. -We will conduct blood work to monitor your condition.  HYPERTENSION: Your blood pressure is currently controlled with your medication regimen. -Continue taking olmesartan  40 mg daily and chlorthalidone  50 mg daily.  HYPERLIPIDEMIA: Your cholesterol is being managed with Repatha  injections and krill oil. -Continue Repatha  injections every two weeks. -Continue taking krill oil.  ALCOHOL USE DISORDER: Your alcohol use has reduced and is being managed with naltrexone . -Continue taking naltrexone . -Be mindful of your liver health.  INSOMNIA: Your sleep issues are being managed with Ambien . -Continue taking Ambien  as prescribed.  CUTANEOUS LESION: You have a benign lipoma on your rib cage that has increased in size. -No intervention is needed unless it becomes symptomatic.

## 2024-03-11 NOTE — Progress Notes (Unsigned)
 Subjective:  Patient ID: David Murphy, male    DOB: February 02, 1971  Age: 53 y.o. MRN: [id number]  Chief Complaint  Patient presents with   Medical Management of Chronic Issues    HPI: Discussed the use of AI scribe software for clinical note transcription with the patient, who gave verbal consent to proceed.  History of Present Illness   David Murphy is a 53 year old male with hypertension who presents with medication-related foot swelling.  Peripheral edema - Foot swelling attributed to amlodipine  dosage change - Initially increased amlodipine  from 5 mg to 10 mg, but due to misunderstanding, took both doses for a total of 15 mg daily - Currently taking 5 mg amlodipine  daily - Swelling persists but is less severe than at higher dose - No significant discomfort from swelling - No associated new symptoms  Hypertension management - Currently taking amlodipine  5 mg daily, Olmesartan  40 mg daily, and chlorthalidone  50 mg daily for blood pressure control  Hyperlipidemia management - Currently taking Repatha  injections every two weeks and krill oil - Discontinued previous oral cholesterol medication upon starting Repatha   Alcohol use disorder - On naltrexone  for alcohol use disorder - Alcohol consumption limited to weekends, reduced from previous habits  Sleep disturbance - Continues to take Ambien  for sleep  Testosterone  replacement therapy discontinuation - Discontinued testosterone  gel due to inconsistent use  Cutaneous lesion - Concern about a spot on the rib cage that has enlarged over the years - Initial trauma to the area from scratching with a nail - No pain associated with the lesion  Constitutional and systemic symptoms - No fevers, chills, sweats, earaches, sore throat, stuffy nose, chest pain, abdominal pain, bowel or bladder issues, joint pain, muscle pain, dizziness, or depression          12/04/2023    2:28 PM 07/29/2023    3:17 PM 04/16/2023    2:49 PM  10/22/2022    2:39 PM 10/16/2021    3:16 PM  Depression screen PHQ 2/9  Decreased Interest 0 0 0 0 0  Down, Depressed, Hopeless 0 0 0 0 0  PHQ - 2 Score 0 0 0 0 0  Altered sleeping  0 0    Tired, decreased energy  0 0    Change in appetite  0 0    Feeling bad or failure about yourself   0 0    Trouble concentrating  0 0    Moving slowly or fidgety/restless  0 0    Suicidal thoughts  0 0    PHQ-9 Score  0 0    Difficult doing work/chores  Not difficult at all Not difficult at all          03/11/2024    2:34 PM  Fall Risk   Falls in the past year? 0  Number falls in past yr: 0  Injury with Fall? 0  Risk for fall due to : No Fall Risks  Follow up Falls evaluation completed    Patient Care Team: Teressa Harrie HERO, FNP as PCP - General (Family Medicine) Misenheimer, Evalene, MD as Consulting Physician (Unknown Physician Specialty)   Review of Systems  Constitutional:  Negative for chills, diaphoresis, fatigue and fever.  HENT:  Negative for congestion, ear pain and sore throat.   Respiratory:  Negative for cough and shortness of breath.   Cardiovascular:  Negative for chest pain and leg swelling.  Gastrointestinal:  Negative for abdominal pain, constipation, diarrhea, nausea and vomiting.  Genitourinary:  Negative for dysuria and urgency.  Musculoskeletal:  Negative for arthralgias and myalgias.  Neurological:  Negative for dizziness and headaches.  Psychiatric/Behavioral:  Negative for dysphoric mood.     Current Outpatient Medications on File Prior to Visit  Medication Sig Dispense Refill   chlorthalidone  (HYGROTON ) 50 MG tablet Take 1 tablet (50 mg total) by mouth daily. 30 tablet 0   cyclobenzaprine  (FLEXERIL ) 10 MG tablet Take 1 tablet (10 mg total) by mouth 3 (three) times daily as needed. for muscle spams 270 tablet 1   Evolocumab  (REPATHA  SURECLICK) 140 MG/ML SOAJ Inject 140 mg into the skin every 14 (fourteen) days. 6 mL 1   Krill Oil 500 MG CAPS Take 500 mg by mouth  daily.     naltrexone  (DEPADE) 50 MG tablet Take 1 tablet (50 mg total) by mouth daily. 90 tablet 1   olmesartan  (BENICAR ) 40 MG tablet Take 1 tablet (40 mg total) by mouth daily. 90 tablet 0   sildenafil  (VIAGRA ) 50 MG tablet TAKE 1 TABLET BY MOUTH ONCE DAILY AS NEEDED FOR ERECTILE DYSFUNCTION 50 tablet 0   zolpidem  (AMBIEN ) 10 MG tablet Take 1 tablet (10 mg total) by mouth at bedtime. 90 tablet 0   No current facility-administered medications on file prior to visit.   Past Medical History:  Diagnosis Date   Acute hepatitis B without delta-agent and without hepatic coma    Essential hypertension    Mixed hyperlipidemia    Primary insomnia    Testicular hypofunction    Past Surgical History:  Procedure Laterality Date   HERNIA REPAIR      Family History  Problem Relation Age of Onset   Hyperlipidemia Mother    Diabetes Mother    Hyperlipidemia Father    Social History   Socioeconomic History   Marital status: Divorced    Spouse name: Not on file   Number of children: 1   Years of education: Not on file   Highest education level: Not on file  Occupational History   Occupation: Delivery Driver    Comment: [employer]  Tobacco Use   Smoking status: Never   Smokeless tobacco: Never  Vaping Use   Vaping status: Never Used  Substance and Sexual Activity   Alcohol use: Yes    Alcohol/week: 35.0 standard drinks of alcohol    Types: 35 Cans of beer per week    Comment: Alcoholic, currently not in treatment, drink 4-5 cans of beer daily, some more   Drug use: Never   Sexual activity: Not Currently    Partners: Female  Other Topics Concern   Not on file  Social History Narrative   Not on file   Social Drivers of Health   Financial Resource Strain: Low Risk  (12/04/2023)   Overall Financial Resource Strain (CARDIA)    Difficulty of Paying Living Expenses: Not hard at all  Food Insecurity: No Food Insecurity (12/04/2023)   Hunger Vital Sign    Worried About Running Out of  Food in the Last Year: Never true    Ran Out of Food in the Last Year: Never true  Transportation Needs: No Transportation Needs (12/04/2023)   PRAPARE - Administrator, Civil Service (Medical): No    Lack of Transportation (Non-Medical): No  Physical Activity: Inactive (12/04/2023)   Exercise Vital Sign    Days of Exercise per Week: 0 days    Minutes of Exercise per Session: 0 min  Stress: No Stress Concern Present (12/04/2023)  [employer] of Occupational Health - Occupational Stress Questionnaire    Feeling of Stress : Not at all  Social Connections: Socially Isolated (12/04/2023)   Social Connection and Isolation Panel    Frequency of Communication with Friends and Family: More than three times a week    Frequency of Social Gatherings with Friends and Family: More than three times a week    Attends Religious Services: Never    Database administrator or Organizations: No    Attends Engineer, structural: Never    Marital Status: Divorced    Objective:  BP 124/76   Pulse (!) 101   Temp 98.1 F (36.7 C)   Ht 6' (1.829 m)   Wt 234 lb (106.1 kg)   SpO2 96%   BMI 31.74 kg/m      03/11/2024    2:31 PM 12/25/2023    2:20 PM 12/04/2023    2:26 PM  BP/Weight  Systolic BP 124 130 148  Diastolic BP 76 62 100  Wt. (Lbs) 234 234.8 235  BMI 31.74 kg/m2 43.29 kg/m2 30.34 kg/m2    Physical Exam Vitals reviewed.  Constitutional:      Appearance: Normal appearance.  Neck:     Vascular: No carotid bruit.  Cardiovascular:     Rate and Rhythm: Normal rate and regular rhythm.     Heart sounds: Normal heart sounds.  Pulmonary:     Effort: Pulmonary effort is normal.     Breath sounds: Normal breath sounds. No wheezing, rhonchi or rales.  Abdominal:     General: Bowel sounds are normal.     Palpations: Abdomen is soft.     Tenderness: There is no abdominal tenderness.  Skin:    Findings: Lesion (lipoma approximately 2 cm in diameter over hte right lower  anterior rib cage.) present.  Neurological:     Mental Status: He is alert and oriented to person, place, and time.  Psychiatric:        Mood and Affect: Mood normal.        Behavior: Behavior normal.         Lab Results  Component Value Date   WBC 7.1 03/11/2024   HGB 15.6 03/11/2024   HCT 47.2 03/11/2024   PLT 202 03/11/2024   GLUCOSE 81 03/11/2024   CHOL 165 03/11/2024   TRIG 174 (H) 03/11/2024   HDL 52 03/11/2024   LDLCALC 83 03/11/2024   ALT 145 (H) 03/11/2024   AST 72 (H) 03/11/2024   NA 143 03/11/2024   K 4.5 03/11/2024   CL 101 03/11/2024   CREATININE 0.96 03/11/2024   BUN 11 03/11/2024   CO2 23 03/11/2024   TSH 3.400 04/16/2023      Assessment & Plan:  Essential hypertension Assessment & Plan: Hypertension controlled with current regimen. Peripheral edema likely due to amlodipine . - Discontinue amlodipine  after [place]  trip. - Start diltiazem  120 mg daily post-trip. - Monitor home blood pressure. - Conduct blood work.  Orders: -     CBC with Differential/Platelet  Elevated liver enzymes Assessment & Plan: Check labs  Orders: -     Lipid panel  Mixed hyperlipidemia Assessment & Plan: Managed with Repatha  biweekly. Discontinued oral cholesterol medication. Continues krill oil.   Alcohol use Assessment & Plan: Alcohol use reduced to weekends. Continues naltrexone . Liver health concern noted.   Other insomnia Assessment & Plan: Managed with Ambien .   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   Abnormal laboratory test -     Comprehensive metabolic panel with GFR  Other orders -     dilTIAZem  HCl ER Coated Beads; Take 1 capsule (120 mg total) by mouth daily.  Dispense: 30 capsule; Refill: 2     Meds ordered this encounter  Medications   diltiazem  (CARDIZEM  CD) 120 MG 24 hr capsule    Sig: Take 1 capsule  (120 mg total) by mouth daily.    Dispense:  30 capsule    Refill:  2    Orders Placed This Encounter  Procedures   Comprehensive metabolic panel with GFR   Lipid panel   CBC with Differential/Platelet     Follow-up: Return in about 3 months (around 06/11/2024) for chronic follow up.  I,Marla I Leal-Borjas,acting as a scribe for Abigail Free, MD.,have documented all relevant documentation on the behalf of Abigail Free, MD,as directed by  Abigail Free, MD while in the presence of Abigail Free, MD.    An After Visit Summary was printed and given to the patient.  I attest that I have reviewed this visit and agree with the plan scribed by my staff.  Abigail Free, MD [facility] [phone]

## 2024-03-12 ENCOUNTER — Ambulatory Visit: Payer: Self-pay | Admitting: Family Medicine

## 2024-03-12 DIAGNOSIS — D171 Benign lipomatous neoplasm of skin and subcutaneous tissue of trunk: Secondary | ICD-10-CM | POA: Insufficient documentation

## 2024-03-12 LAB — COMPREHENSIVE METABOLIC PANEL WITH GFR
ALT: 145 IU/L — ABNORMAL HIGH (ref 0–44)
AST: 72 IU/L — ABNORMAL HIGH (ref 0–40)
Albumin: 4.7 g/dL (ref 3.8–4.9)
Alkaline Phosphatase: 82 IU/L (ref 44–121)
BUN/Creatinine Ratio: 11 (ref 9–20)
BUN: 11 mg/dL (ref 6–24)
Bilirubin Total: 0.7 mg/dL (ref 0.0–1.2)
CO2: 23 mmol/L (ref 20–29)
Calcium: 10 mg/dL (ref 8.7–10.2)
Chloride: 101 mmol/L (ref 96–106)
Creatinine, Ser: 0.96 mg/dL (ref 0.76–1.27)
Globulin, Total: 2.5 g/dL (ref 1.5–4.5)
Glucose: 81 mg/dL (ref 70–99)
Potassium: 4.5 mmol/L (ref 3.5–5.2)
Sodium: 143 mmol/L (ref 134–144)
Total Protein: 7.2 g/dL (ref 6.0–8.5)
eGFR: 96 mL/min/1.73 (ref >59)

## 2024-03-12 LAB — CBC WITH DIFFERENTIAL/PLATELET
Basophils Absolute: 0.1 x10E3/uL (ref 0.0–0.2)
Basos: 1 %
EOS (ABSOLUTE): 0.3 x10E3/uL (ref 0.0–0.4)
Eos: 4 %
Hematocrit: 47.2 % (ref 37.5–51.0)
Hemoglobin: 15.6 g/dL (ref 13.0–17.7)
Immature Grans (Abs): 0 x10E3/uL (ref 0.0–0.1)
Immature Granulocytes: 0 %
Lymphocytes Absolute: 1.8 x10E3/uL (ref 0.7–3.1)
Lymphs: 25 %
MCH: 30.9 pg (ref 26.6–33.0)
MCHC: 33.1 g/dL (ref 31.5–35.7)
MCV: 94 fL (ref 79–97)
Monocytes Absolute: 0.6 x10E3/uL (ref 0.1–0.9)
Monocytes: 9 %
Neutrophils Absolute: 4.3 x10E3/uL (ref 1.4–7.0)
Neutrophils: 60 %
Platelets: 202 x10E3/uL (ref 150–450)
RBC: 5.05 x10E6/uL (ref 4.14–5.80)
RDW: 12.2 % (ref 11.6–15.4)
WBC: 7.1 x10E3/uL (ref 3.4–10.8)

## 2024-03-12 LAB — LIPID PANEL
Chol/HDL Ratio: 3.2 ratio (ref 0.0–5.0)
Cholesterol, Total: 165 mg/dL (ref 100–199)
HDL: 52 mg/dL (ref >39)
LDL Chol Calc (NIH): 83 mg/dL (ref 0–99)
Triglycerides: 174 mg/dL — ABNORMAL HIGH (ref 0–149)
VLDL Cholesterol Cal: 30 mg/dL (ref 5–40)

## 2024-03-12 NOTE — Assessment & Plan Note (Signed)
 Alcohol use reduced to weekends. Continues naltrexone . Liver health concern noted.

## 2024-03-12 NOTE — Assessment & Plan Note (Signed)
Managed with Ambien.

## 2024-03-12 NOTE — Assessment & Plan Note (Signed)
 Managed with Repatha  biweekly. Discontinued oral cholesterol medication. Continues krill oil.

## 2024-03-12 NOTE — Assessment & Plan Note (Signed)
 Check labs

## 2024-03-12 NOTE — Assessment & Plan Note (Signed)
 Hypertension controlled with current regimen. Peripheral edema likely due to amlodipine . - Discontinue amlodipine  after [place]  trip. - Start diltiazem  120 mg daily post-trip. - Monitor home blood pressure. - Conduct blood work.

## 2024-03-12 NOTE — Assessment & Plan Note (Signed)
 Lipoma increased in size, benign, no intervention needed unless symptomatic.

## 2024-03-26 ENCOUNTER — Other Ambulatory Visit: Payer: Self-pay

## 2024-03-26 DIAGNOSIS — G4709 Other insomnia: Secondary | ICD-10-CM

## 2024-03-26 MED ORDER — ZOLPIDEM TARTRATE 10 MG PO TABS: 10.0000 mg | ORAL_TABLET | Freq: Every day | ORAL | 0 refills | Status: DC

## 2024-03-26 NOTE — Telephone Encounter (Signed)
 Copied from CRM [id number]. Topic: Clinical - Medication Question >> Mar 26, 2024 12:19 PM Fonda T wrote: Reason for CRM: Received call from Robin with [facility] pharmacy, for early refill on medication for patient, as per patient states he is going out of town, reason for early refill request.  Medication: zolpidem (AMBIEN) 10 MG tablet  Pharmacy:   [facility] [address], [place] - [address] [facility] [place] [address] Phone: [phone] Fax: [phone]  Pharmacy Can be reached at [phone] to advise.

## 2024-03-26 NOTE — Telephone Encounter (Signed)
 Refill requested was sent to provider.

## 2024-05-21 ENCOUNTER — Other Ambulatory Visit: Payer: Self-pay | Admitting: Family Medicine

## 2024-05-21 DIAGNOSIS — E291 Testicular hypofunction: Secondary | ICD-10-CM

## 2024-06-15 ENCOUNTER — Encounter: Payer: Self-pay | Admitting: Family Medicine

## 2024-06-15 ENCOUNTER — Other Ambulatory Visit: Payer: Self-pay | Admitting: Family Medicine

## 2024-06-15 ENCOUNTER — Ambulatory Visit (INDEPENDENT_AMBULATORY_CARE_PROVIDER_SITE_OTHER): Admitting: Family Medicine

## 2024-06-15 VITALS — BP 152/100 | HR 95 | Temp 98.3°F | Resp 16 | Ht 72.0 in | Wt 239.8 lb

## 2024-06-15 DIAGNOSIS — E6609 Other obesity due to excess calories: Secondary | ICD-10-CM

## 2024-06-15 DIAGNOSIS — R748 Abnormal levels of other serum enzymes: Secondary | ICD-10-CM | POA: Diagnosis not present

## 2024-06-15 DIAGNOSIS — E782 Mixed hyperlipidemia: Secondary | ICD-10-CM | POA: Diagnosis not present

## 2024-06-15 DIAGNOSIS — F1090 Alcohol use, unspecified, uncomplicated: Secondary | ICD-10-CM | POA: Diagnosis not present

## 2024-06-15 DIAGNOSIS — Z6832 Body mass index (BMI) 32.0-32.9, adult: Secondary | ICD-10-CM

## 2024-06-15 DIAGNOSIS — I10 Essential (primary) hypertension: Secondary | ICD-10-CM | POA: Diagnosis not present

## 2024-06-15 DIAGNOSIS — E66811 Obesity, class 1: Secondary | ICD-10-CM | POA: Insufficient documentation

## 2024-06-15 DIAGNOSIS — G4709 Other insomnia: Secondary | ICD-10-CM

## 2024-06-15 MED ORDER — REPATHA SURECLICK 140 MG/ML ~~LOC~~ SOAJ: 1.0000 mL | SUBCUTANEOUS | 1 refills | Status: AC

## 2024-06-15 MED ORDER — OLMESARTAN MEDOXOMIL 40 MG PO TABS: 40.0000 mg | ORAL_TABLET | Freq: Every day | ORAL | 0 refills | Status: AC

## 2024-06-15 MED ORDER — NALTREXONE HCL 50 MG PO TABS: 50.0000 mg | ORAL_TABLET | Freq: Every day | ORAL | 1 refills | Status: AC

## 2024-06-15 NOTE — Assessment & Plan Note (Addendum)
 Alcohol use disorder Ongoing disorder with elevated liver enzymes. Liver enzymes improving with medication. - Continue medication to reduce alcohol consumption. - Monitor liver enzymes regularly.  Orders:   naltrexone  (DEPADE) 50 MG tablet; Take 1 tablet (50 mg total) by mouth daily.

## 2024-06-15 NOTE — Assessment & Plan Note (Addendum)
 Improving Last metabolic panel Lab Results  Component Value Date   GLUCOSE 81 03/11/2024   NA 143 03/11/2024   K 4.5 03/11/2024   CL 101 03/11/2024   CO2 23 03/11/2024   BUN 11 03/11/2024   CREATININE 0.96 03/11/2024   EGFR 96 03/11/2024   CALCIUM  10.0 03/11/2024   PROT 7.2 03/11/2024   ALBUMIN 4.7 03/11/2024   LABGLOB 2.5 03/11/2024   AGRATIO 2.0 12/03/2022   BILITOT 0.7 03/11/2024   ALKPHOS 82 03/11/2024   AST 72 (H) 03/11/2024   ALT 145 (H) 03/11/2024    Liver Ultrasound already completed last year December showed - Increased hepatic parenchymal echogenicity. Focal fatty sparing. Portal vein is patent on color Doppler imaging with normal direction of blood flow towards the liver. - labs drawn today Orders:   Comprehensive metabolic panel with GFR   Evolocumab  (REPATHA  SURECLICK) 140 MG/ML SOAJ; Inject 140 mg into the skin every 14 (fourteen) days.

## 2024-06-15 NOTE — Assessment & Plan Note (Addendum)
 Hyperlipidemia Managed with Repatha , LDL normal, triglycerides slightly elevated possibly due to non-fasting. - Continue Repatha  biweekly. - Continues krill oil. - Recheck cholesterol levels.   Orders:   Lipid panel   Evolocumab  (REPATHA  SURECLICK) 140 MG/ML SOAJ; Inject 140 mg into the skin every 14 (fourteen) days.

## 2024-06-15 NOTE — Assessment & Plan Note (Signed)
 BMI 32.52, not at goal - Recommend continue working on diet and exercise  - Gained 5 pounds from last visit. - Recommend Mediterranean diet

## 2024-06-15 NOTE — Progress Notes (Signed)
 Subjective:  Patient ID: David Murphy, male    DOB: [birth year]  Age: 53 y.o. MRN: [id number]  Chief Complaint  Patient presents with   Medical Management of Chronic Issues   Discussed the use of AI scribe software for clinical note transcription with the patient, who gave verbal consent to proceed.  History of Present Illness   David Murphy is a 53 year old male who presents for medical management of chronic issues.  Peripheral edema and lower extremity symptoms - Improvement in leg swelling, now limited to slight swelling between toes - History of foot fracture approximately six months ago - Amlodipine  discontinued due to edema - Previously took chlorthalidone  50 mg for pedal edema  Hypertension management - Currently taking diltiazem  120 mg  - Mistakenly discontinued olmesartan  and amlodipine  - Denies chest pain, leg swelling, or shortness of breath  Hepatic enzyme elevation - Elevated liver enzymes with downward trend - ALT decreased from 209 to 145 - AST decreased from 160 to 72 - Liver ultrasound performed in December of previous year - On medication to reduce alcohol consumption  Sleep disturbance - Poor sleep, averaging five hours nightly with Ambien  - Unable to sleep without Ambien   Dyslipidemia management - On Repatha  since early last year for cholesterol management - LDL within normal range - Triglycerides slightly elevated at last check - Weight gain of five pounds since last visit - No regular exercise, but remains active at work         12/04/2023    2:28 PM 07/29/2023    3:17 PM 04/16/2023    2:49 PM 10/22/2022    2:39 PM 10/16/2021    3:16 PM  Depression screen PHQ 2/9  Decreased Interest 0 0 0 0 0  Down, Depressed, Hopeless 0 0 0 0 0  PHQ - 2 Score 0 0 0 0 0  Altered sleeping  0 0    Tired, decreased energy  0 0    Change in appetite  0 0    Feeling bad or failure about yourself   0 0    Trouble concentrating  0 0    Moving slowly or  fidgety/restless  0 0    Suicidal thoughts  0 0    PHQ-9 Score  0 0    Difficult doing work/chores  Not difficult at all Not difficult at all          03/11/2024    2:34 PM  Fall Risk   Falls in the past year? 0  Number falls in past yr: 0  Injury with Fall? 0  Risk for fall due to : No Fall Risks  Follow up Falls evaluation completed    Patient Care Team: Teressa Harrie HERO, FNP as PCP - General (Family Medicine) Misenheimer, Evalene, MD as Consulting Physician (Unknown Physician Specialty)   Review of Systems  Constitutional:  Negative for appetite change, fatigue and fever.  HENT:  Negative for congestion, ear pain, sinus pressure and sore throat.   Eyes: Negative.   Respiratory:  Negative for cough, chest tightness, shortness of breath and wheezing.   Cardiovascular:  Negative for chest pain, palpitations and leg swelling.  Gastrointestinal:  Negative for abdominal pain, constipation, diarrhea, nausea and vomiting.  Endocrine: Negative.   Genitourinary:  Negative for dysuria, frequency, hematuria and urgency.  Musculoskeletal:  Negative for arthralgias, back pain, joint swelling and myalgias.  Skin:  Negative for rash.  Allergic/Immunologic: Negative.   Neurological:  Negative for dizziness, weakness, light-headedness and  headaches.  Hematological: Negative.   Psychiatric/Behavioral:  Positive for sleep disturbance. Negative for dysphoric mood. The patient is not nervous/anxious.     Current Outpatient Medications on File Prior to Visit  Medication Sig Dispense Refill   cyclobenzaprine  (FLEXERIL ) 10 MG tablet Take 1 tablet (10 mg total) by mouth 3 (three) times daily as needed. for muscle spams 270 tablet 1   Krill Oil 500 MG CAPS Take 500 mg by mouth daily.     sildenafil  (VIAGRA ) 50 MG tablet TAKE 1 TABLET BY MOUTH ONCE DAILY AS NEEDED FOR ERECTILE DYSFUNCTION 50 tablet 0   zolpidem  (AMBIEN ) 10 MG tablet Take 1 tablet (10 mg total) by mouth at bedtime. 90 tablet 0    chlorthalidone  (HYGROTON ) 50 MG tablet Take 1 tablet (50 mg total) by mouth daily. (Patient not taking: Reported on 06/15/2024) 30 tablet 0   No current facility-administered medications on file prior to visit.   Past Medical History:  Diagnosis Date   Acute hepatitis B without delta-agent and without hepatic coma    Essential hypertension    Mixed hyperlipidemia    Primary insomnia    Testicular hypofunction    Past Surgical History:  Procedure Laterality Date   HERNIA REPAIR      Family History  Problem Relation Age of Onset   Hyperlipidemia Mother    Diabetes Mother    Hyperlipidemia Father    Social History   Socioeconomic History   Marital status: Divorced    Spouse name: Not on file   Number of children: 1   Years of education: Not on file   Highest education level: Not on file  Occupational History   Occupation: Delivery Driver    Comment: [employer]  Tobacco Use   Smoking status: Never   Smokeless tobacco: Never  Vaping Use   Vaping status: Never Used  Substance and Sexual Activity   Alcohol use: Yes    Alcohol/week: 35.0 standard drinks of alcohol    Types: 35 Cans of beer per week    Comment: Alcoholic, currently not in treatment, drink 4-5 cans of beer daily, some more   Drug use: Never   Sexual activity: Not Currently    Partners: Female  Other Topics Concern   Not on file  Social History Narrative   Not on file   Social Drivers of Health   Financial Resource Strain: Low Risk  (12/04/2023)   Overall Financial Resource Strain (CARDIA)    Difficulty of Paying Living Expenses: Not hard at all  Food Insecurity: No Food Insecurity (12/04/2023)   Hunger Vital Sign    Worried About Running Out of Food in the Last Year: Never true    Ran Out of Food in the Last Year: Never true  Transportation Needs: No Transportation Needs (12/04/2023)   PRAPARE - Administrator, Civil Service (Medical): No    Lack of Transportation (Non-Medical): No   Physical Activity: Inactive (12/04/2023)   Exercise Vital Sign    Days of Exercise per Week: 0 days    Minutes of Exercise per Session: 0 min  Stress: No Stress Concern Present (12/04/2023)   [employer] of Occupational Health - Occupational Stress Questionnaire    Feeling of Stress : Not at all  Social Connections: Socially Isolated (12/04/2023)   Social Connection and Isolation Panel    Frequency of Communication with Friends and Family: More than three times a week    Frequency of Social Gatherings with Friends and Family: More  than three times a week    Attends Religious Services: Never    Active Member of Clubs or Organizations: No    Attends Banker Meetings: Never    Marital Status: Divorced    Objective:  BP (!) 152/100   Pulse 95   Temp 98.3 F (36.8 C) (Temporal)   Resp 16   Ht 6' (1.829 m)   Wt 239 lb 12.8 oz (108.8 kg)   SpO2 97%   BMI 32.52 kg/m      06/15/2024    2:17 PM 03/11/2024    2:31 PM 12/25/2023    2:20 PM  BP/Weight  Systolic BP 152 124 130  Diastolic BP 100 76 62  Wt. (Lbs) 239.8 234 234.8  BMI 32.52 kg/m2 31.74 kg/m2 43.29 kg/m2    Physical Exam Vitals reviewed.  Constitutional:      General: He is not in acute distress.    Appearance: Normal appearance. He is obese. He is not ill-appearing.  Eyes:     Conjunctiva/sclera: Conjunctivae normal.  Cardiovascular:     Rate and Rhythm: Normal rate and regular rhythm.     Heart sounds: Normal heart sounds. No murmur heard. Pulmonary:     Effort: Pulmonary effort is normal.     Breath sounds: Normal breath sounds. No wheezing.  Musculoskeletal:        General: Normal range of motion.  Neurological:     Mental Status: He is alert. Mental status is at baseline.  Psychiatric:        Mood and Affect: Mood normal.        Behavior: Behavior normal.     Lab Results  Component Value Date   WBC 7.1 03/11/2024   HGB 15.6 03/11/2024   HCT 47.2 03/11/2024   PLT 202 03/11/2024    GLUCOSE 81 03/11/2024   CHOL 165 03/11/2024   TRIG 174 (H) 03/11/2024   HDL 52 03/11/2024   LDLCALC 83 03/11/2024   ALT 145 (H) 03/11/2024   AST 72 (H) 03/11/2024   NA 143 03/11/2024   K 4.5 03/11/2024   CL 101 03/11/2024   CREATININE 0.96 03/11/2024   BUN 11 03/11/2024   CO2 23 03/11/2024   TSH 3.400 04/16/2023    Results for orders placed or performed in visit on 03/11/24  Comprehensive metabolic panel with GFR   Collection Time: 03/11/24  3:12 PM  Result Value Ref Range   Glucose 81 70 - 99 mg/dL   BUN 11 6 - 24 mg/dL   Creatinine, Ser 9.03 0.76 - 1.27 mg/dL   eGFR 96 >40 fO/fpw/8.26   BUN/Creatinine Ratio 11 9 - 20   Sodium 143 134 - 144 mmol/L   Potassium 4.5 3.5 - 5.2 mmol/L   Chloride 101 96 - 106 mmol/L   CO2 23 20 - 29 mmol/L   Calcium  10.0 8.7 - 10.2 mg/dL   Total Protein 7.2 6.0 - 8.5 g/dL   Albumin 4.7 3.8 - 4.9 g/dL   Globulin, Total 2.5 1.5 - 4.5 g/dL   Bilirubin Total 0.7 0.0 - 1.2 mg/dL   Alkaline Phosphatase 82 44 - 121 IU/L   AST 72 (H) 0 - 40 IU/L   ALT 145 (H) 0 - 44 IU/L  Lipid panel   Collection Time: 03/11/24  3:12 PM  Result Value Ref Range   Cholesterol, Total 165 100 - 199 mg/dL   Triglycerides 825 (H) 0 - 149 mg/dL   HDL 52 >60 mg/dL   VLDL  Cholesterol Cal 30 5 - 40 mg/dL   LDL Chol Calc (NIH) 83 0 - 99 mg/dL   Chol/HDL Ratio 3.2 0.0 - 5.0 ratio  CBC with Differential/Platelet   Collection Time: 03/11/24  3:12 PM  Result Value Ref Range   WBC 7.1 3.4 - 10.8 x10E3/uL   RBC 5.05 4.14 - 5.80 x10E6/uL   Hemoglobin 15.6 13.0 - 17.7 g/dL   Hematocrit 52.7 62.4 - 51.0 %   MCV 94 79 - 97 fL   MCH 30.9 26.6 - 33.0 pg   MCHC 33.1 31.5 - 35.7 g/dL   RDW 87.7 88.3 - 84.5 %   Platelets 202 150 - 450 x10E3/uL   Neutrophils 60 Not Estab. %   Lymphs 25 Not Estab. %   Monocytes 9 Not Estab. %   Eos 4 Not Estab. %   Basos 1 Not Estab. %   Neutrophils Absolute 4.3 1.4 - 7.0 x10E3/uL   Lymphocytes Absolute 1.8 0.7 - 3.1 x10E3/uL   Monocytes  Absolute 0.6 0.1 - 0.9 x10E3/uL   EOS (ABSOLUTE) 0.3 0.0 - 0.4 x10E3/uL   Basophils Absolute 0.1 0.0 - 0.2 x10E3/uL   Immature Granulocytes 0 Not Estab. %   Immature Grans (Abs) 0.0 0.0 - 0.1 x10E3/uL  .  Assessment & Plan:   Assessment & Plan Essential hypertension Hypertension Uncontrolled with BP 152/100 mmHg. Olmesartan  was mistakenly discontinued. BP Readings from Last 3 Encounters:  06/15/24 (!) 152/100  03/11/24 124/76  12/25/23 130/62  - Restart olmesartan  40 mg daily. - Continue diltiazem  120 mg daily. - Scheduled nurse visit for BP recheck in 2 weeks.  Orders:   CBC with Differential/Platelet   olmesartan  (BENICAR ) 40 MG tablet; Take 1 tablet (40 mg total) by mouth daily.  Mixed hyperlipidemia Hyperlipidemia Managed with Repatha , LDL normal, triglycerides slightly elevated possibly due to non-fasting. - Continue Repatha  biweekly. - Continues krill oil. - Recheck cholesterol levels.   Orders:   Lipid panel   Evolocumab  (REPATHA  SURECLICK) 140 MG/ML SOAJ; Inject 140 mg into the skin every 14 (fourteen) days.  Elevated liver enzymes Improving Last metabolic panel Lab Results  Component Value Date   GLUCOSE 81 03/11/2024   NA 143 03/11/2024   K 4.5 03/11/2024   CL 101 03/11/2024   CO2 23 03/11/2024   BUN 11 03/11/2024   CREATININE 0.96 03/11/2024   EGFR 96 03/11/2024   CALCIUM  10.0 03/11/2024   PROT 7.2 03/11/2024   ALBUMIN 4.7 03/11/2024   LABGLOB 2.5 03/11/2024   AGRATIO 2.0 12/03/2022   BILITOT 0.7 03/11/2024   ALKPHOS 82 03/11/2024   AST 72 (H) 03/11/2024   ALT 145 (H) 03/11/2024    Liver Ultrasound already completed last year December showed - Increased hepatic parenchymal echogenicity. Focal fatty sparing. Portal vein is patent on color Doppler imaging with normal direction of blood flow towards the liver. - labs drawn today Orders:   Comprehensive metabolic panel with GFR   Evolocumab  (REPATHA  SURECLICK) 140 MG/ML SOAJ; Inject 140 mg  into the skin every 14 (fourteen) days.  Alcohol use Alcohol use disorder Ongoing disorder with elevated liver enzymes. Liver enzymes improving with medication. - Continue medication to reduce alcohol consumption. - Monitor liver enzymes regularly.  Orders:   naltrexone  (DEPADE) 50 MG tablet; Take 1 tablet (50 mg total) by mouth daily.  Class 1 obesity due to excess calories with serious comorbidity and body mass index (BMI) of 32.0 to 32.9 in adult BMI 32.52, not at goal - Recommend continue working on  diet and exercise  - Gained 5 pounds from last visit. - Recommend Mediterranean diet    Other insomnia Insomnia Chronic insomnia managed with Ambien , effective for 5 hours of sleep. - Continue Ambien  as prescribed.       Meds ordered this encounter  Medications   olmesartan  (BENICAR ) 40 MG tablet    Sig: Take 1 tablet (40 mg total) by mouth daily.    Dispense:  90 tablet    Refill:  0   naltrexone  (DEPADE) 50 MG tablet    Sig: Take 1 tablet (50 mg total) by mouth daily.    Dispense:  90 tablet    Refill:  1   Evolocumab  (REPATHA  SURECLICK) 140 MG/ML SOAJ    Sig: Inject 140 mg into the skin every 14 (fourteen) days.    Dispense:  6 mL    Refill:  1    Orders Placed This Encounter  Procedures   CBC with Differential/Platelet   Comprehensive metabolic panel with GFR   Lipid panel       Follow-up: Return in about 2 weeks (around 06/29/2024) for BP recheck, med check.  An After Visit Summary was printed and given to the patient.  Harrie Cedar, FNP [facility] [phone]

## 2024-06-15 NOTE — Assessment & Plan Note (Addendum)
 Insomnia Chronic insomnia managed with Ambien , effective for 5 hours of sleep. - Continue Ambien  as prescribed.

## 2024-06-15 NOTE — Assessment & Plan Note (Addendum)
 Hypertension Uncontrolled with BP 152/100 mmHg. Olmesartan  was mistakenly discontinued. BP Readings from Last 3 Encounters:  06/15/24 (!) 152/100  03/11/24 124/76  12/25/23 130/62  - Restart olmesartan  40 mg daily. - Continue diltiazem  120 mg daily. - Scheduled nurse visit for BP recheck in 2 weeks.  Orders:   CBC with Differential/Platelet   olmesartan  (BENICAR ) 40 MG tablet; Take 1 tablet (40 mg total) by mouth daily.

## 2024-06-16 LAB — CBC WITH DIFFERENTIAL/PLATELET
Basophils Absolute: 0.1 x10E3/uL (ref 0.0–0.2)
Basos: 1 %
EOS (ABSOLUTE): 0.3 x10E3/uL (ref 0.0–0.4)
Eos: 4 %
Hematocrit: 53.8 % — ABNORMAL HIGH (ref 37.5–51.0)
Hemoglobin: 17.6 g/dL (ref 13.0–17.7)
Immature Grans (Abs): 0.1 x10E3/uL (ref 0.0–0.1)
Immature Granulocytes: 1 %
Lymphocytes Absolute: 1.8 x10E3/uL (ref 0.7–3.1)
Lymphs: 22 %
MCH: 30.5 pg (ref 26.6–33.0)
MCHC: 32.7 g/dL (ref 31.5–35.7)
MCV: 93 fL (ref 79–97)
Monocytes Absolute: 0.7 x10E3/uL (ref 0.1–0.9)
Monocytes: 8 %
Neutrophils Absolute: 5.2 x10E3/uL (ref 1.4–7.0)
Neutrophils: 64 %
Platelets: 204 x10E3/uL (ref 150–450)
RBC: 5.77 x10E6/uL (ref 4.14–5.80)
RDW: 12.3 % (ref 11.6–15.4)
WBC: 8.1 x10E3/uL (ref 3.4–10.8)

## 2024-06-16 LAB — LIPID PANEL
Chol/HDL Ratio: 3.1 ratio (ref 0.0–5.0)
Cholesterol, Total: 157 mg/dL (ref 100–199)
HDL: 51 mg/dL (ref >39)
LDL Chol Calc (NIH): 61 mg/dL (ref 0–99)
Triglycerides: 289 mg/dL — ABNORMAL HIGH (ref 0–149)
VLDL Cholesterol Cal: 45 mg/dL — ABNORMAL HIGH (ref 5–40)

## 2024-06-16 LAB — COMPREHENSIVE METABOLIC PANEL WITH GFR
ALT: 219 IU/L (ref 0–44)
AST: 79 IU/L — ABNORMAL HIGH (ref 0–40)
Albumin: 5 g/dL — ABNORMAL HIGH (ref 3.8–4.9)
Alkaline Phosphatase: 104 IU/L (ref 47–123)
BUN/Creatinine Ratio: 11 (ref 9–20)
BUN: 12 mg/dL (ref 6–24)
Bilirubin Total: 0.4 mg/dL (ref 0.0–1.2)
CO2: 24 mmol/L (ref 20–29)
Calcium: 10.6 mg/dL — ABNORMAL HIGH (ref 8.7–10.2)
Chloride: 100 mmol/L (ref 96–106)
Creatinine, Ser: 1.06 mg/dL (ref 0.76–1.27)
Globulin, Total: 2.7 g/dL (ref 1.5–4.5)
Glucose: 93 mg/dL (ref 70–99)
Potassium: 4.6 mmol/L (ref 3.5–5.2)
Sodium: 144 mmol/L (ref 134–144)
Total Protein: 7.7 g/dL (ref 6.0–8.5)
eGFR: 84 mL/min/1.73 (ref >59)

## 2024-06-17 ENCOUNTER — Ambulatory Visit: Payer: Self-pay | Admitting: Family Medicine

## 2024-06-29 ENCOUNTER — Encounter: Payer: Self-pay | Admitting: Family Medicine

## 2024-06-29 ENCOUNTER — Ambulatory Visit (INDEPENDENT_AMBULATORY_CARE_PROVIDER_SITE_OTHER): Admitting: Family Medicine

## 2024-06-29 VITALS — BP 118/76 | HR 93 | Temp 98.1°F | Resp 18 | Ht 72.0 in | Wt 241.2 lb

## 2024-06-29 DIAGNOSIS — S63501A Unspecified sprain of right wrist, initial encounter: Secondary | ICD-10-CM

## 2024-06-29 DIAGNOSIS — R748 Abnormal levels of other serum enzymes: Secondary | ICD-10-CM

## 2024-06-29 DIAGNOSIS — I10 Essential (primary) hypertension: Secondary | ICD-10-CM

## 2024-06-29 NOTE — Progress Notes (Unsigned)
 Subjective:  Patient ID: David Murphy, male    DOB: 1971/08/03  Age: 53 y.o. MRN: [id number]  Chief Complaint  Patient presents with   Medical Management of Chronic Issues    HPI: Discussed the use of AI scribe software for clinical note transcription with the patient, who gave verbal consent to proceed.         12/04/2023    2:28 PM 07/29/2023    3:17 PM 04/16/2023    2:49 PM 10/22/2022    2:39 PM 10/16/2021    3:16 PM  Depression screen PHQ 2/9  Decreased Interest 0 0 0 0 0  Down, Depressed, Hopeless 0 0 0 0 0  PHQ - 2 Score 0 0 0 0 0  Altered sleeping  0 0    Tired, decreased energy  0 0    Change in appetite  0 0    Feeling bad or failure about yourself   0 0    Trouble concentrating  0 0    Moving slowly or fidgety/restless  0 0    Suicidal thoughts  0 0    PHQ-9 Score  0  0     Difficult doing work/chores  Not difficult at all Not difficult at all       Data saved with a previous flowsheet row definition        03/11/2024    2:34 PM  Fall Risk   Falls in the past year? 0  Number falls in past yr: 0  Injury with Fall? 0  Risk for fall due to : No Fall Risks  Follow up Falls evaluation completed    Patient Care Team: Teressa Harrie HERO, FNP as PCP - General (Family Medicine) Misenheimer, Evalene, MD as Consulting Physician (Unknown Physician Specialty)   Review of Systems  Constitutional:  Negative for appetite change, fatigue and fever.  HENT:  Negative for congestion, ear pain, sinus pressure and sore throat.   Eyes: Negative.   Respiratory:  Negative for cough, chest tightness, shortness of breath and wheezing.   Cardiovascular:  Negative for chest pain and palpitations.  Gastrointestinal:  Negative for abdominal pain, constipation, diarrhea, nausea and vomiting.  Endocrine: Negative.   Genitourinary:  Negative for dysuria and hematuria.  Musculoskeletal:  Negative for arthralgias, back pain, joint swelling and myalgias.  Skin:  Negative for rash.   Allergic/Immunologic: Negative.   Neurological:  Negative for dizziness, weakness, light-headedness and headaches.  Hematological: Negative.   Psychiatric/Behavioral:  Negative for dysphoric mood. The patient is not nervous/anxious.     Current Outpatient Medications on File Prior to Visit  Medication Sig Dispense Refill   chlorthalidone  (HYGROTON ) 50 MG tablet Take 1 tablet (50 mg total) by mouth daily. 30 tablet 0   cyclobenzaprine  (FLEXERIL ) 10 MG tablet Take 1 tablet (10 mg total) by mouth 3 (three) times daily as needed. for muscle spams 270 tablet 1   diltiazem  (CARDIZEM  CD) 120 MG 24 hr capsule Take 1 capsule by mouth once daily 30 capsule 0   Evolocumab  (REPATHA  SURECLICK) 140 MG/ML SOAJ Inject 140 mg into the skin every 14 (fourteen) days. 6 mL 1   Krill Oil 500 MG CAPS Take 500 mg by mouth daily.     naltrexone  (DEPADE) 50 MG tablet Take 1 tablet (50 mg total) by mouth daily. 90 tablet 1   olmesartan  (BENICAR ) 40 MG tablet Take 1 tablet (40 mg total) by mouth daily. 90 tablet 0   sildenafil  (VIAGRA ) 50 MG tablet TAKE  1 TABLET BY MOUTH ONCE DAILY AS NEEDED FOR ERECTILE DYSFUNCTION 50 tablet 0   zolpidem  (AMBIEN ) 10 MG tablet Take 1 tablet (10 mg total) by mouth at bedtime. 90 tablet 0   No current facility-administered medications on file prior to visit.   Past Medical History:  Diagnosis Date   Acute hepatitis B without delta-agent and without hepatic coma    Essential hypertension    Mixed hyperlipidemia    Primary insomnia    Testicular hypofunction    Past Surgical History:  Procedure Laterality Date   HERNIA REPAIR      Family History  Problem Relation Age of Onset   Hyperlipidemia Mother    Diabetes Mother    Hyperlipidemia Father    Social History   Socioeconomic History   Marital status: Divorced    Spouse name: Not on file   Number of children: 1   Years of education: Not on file   Highest education level: Not on file  Occupational History    Occupation: Delivery Driver    Comment: [employer]  Tobacco Use   Smoking status: Never   Smokeless tobacco: Never  Vaping Use   Vaping status: Never Used  Substance and Sexual Activity   Alcohol use: Yes    Alcohol/week: 35.0 standard drinks of alcohol    Types: 35 Cans of beer per week    Comment: Alcoholic, currently not in treatment, drink 4-5 cans of beer daily, some more   Drug use: Never   Sexual activity: Not Currently    Partners: Female  Other Topics Concern   Not on file  Social History Narrative   Not on file   Social Drivers of Health   Financial Resource Strain: Low Risk  (12/04/2023)   Overall Financial Resource Strain (CARDIA)    Difficulty of Paying Living Expenses: Not hard at all  Food Insecurity: No Food Insecurity (12/04/2023)   Hunger Vital Sign    Worried About Running Out of Food in the Last Year: Never true    Ran Out of Food in the Last Year: Never true  Transportation Needs: No Transportation Needs (12/04/2023)   PRAPARE - Administrator, Civil Service (Medical): No    Lack of Transportation (Non-Medical): No  Physical Activity: Inactive (12/04/2023)   Exercise Vital Sign    Days of Exercise per Week: 0 days    Minutes of Exercise per Session: 0 min  Stress: No Stress Concern Present (12/04/2023)   [employer] of [employer]ational [employer] - Occupational Stress Questionnaire    Feeling of Stress : Not at all  Social Connections: Socially Isolated (12/04/2023)   Social Connection and Isolation Panel    Frequency of Communication with Friends and Family: More than three times a week    Frequency of Social Gatherings with Friends and Family: More than three times a week    Attends Religious Services: Never    Database Administrator or Organizations: No    Attends Engineer, Structural: Never    Marital Status: Divorced    Objective:  BP 118/76   Pulse 93   Temp 98.1 F (36.7 C) (Temporal)   Resp 18   Ht 6' (1.829 m)   Wt 241  lb 3.2 oz (109.4 kg)   SpO2 95%   BMI 32.71 kg/m      06/29/2024    2:23 PM 06/15/2024    2:17 PM 03/11/2024    2:31 PM  BP/Weight  Systolic BP 118  152 124  Diastolic BP 76 100 76  Wt. (Lbs) 241.2 239.8 234  BMI 32.71 kg/m2 32.52 kg/m2 31.74 kg/m2    Physical Exam  {Perform Simple Foot Exam  Perform Detailed exam:1} {Insert foot Exam (Optional):30965}   Lab Results  Component Value Date   WBC 8.1 06/15/2024   HGB 17.6 06/15/2024   HCT 53.8 (H) 06/15/2024   PLT 204 06/15/2024   GLUCOSE 93 06/15/2024   CHOL 157 06/15/2024   TRIG 289 (H) 06/15/2024   HDL 51 06/15/2024   LDLCALC 61 06/15/2024   ALT 219 (HH) 06/15/2024   AST 79 (H) 06/15/2024   NA 144 06/15/2024   K 4.6 06/15/2024   CL 100 06/15/2024   CREATININE 1.06 06/15/2024   BUN 12 06/15/2024   CO2 24 06/15/2024   TSH 3.400 04/16/2023    Results for orders placed or performed in visit on 06/15/24  CBC with Differential/Platelet   Collection Time: 06/15/24  3:01 PM  Result Value Ref Range   WBC 8.1 3.4 - 10.8 x10E3/uL   RBC 5.77 4.14 - 5.80 x10E6/uL   Hemoglobin 17.6 13.0 - 17.7 g/dL   Hematocrit 46.1 (H) 62.4 - 51.0 %   MCV 93 79 - 97 fL   MCH 30.5 26.6 - 33.0 pg   MCHC 32.7 31.5 - 35.7 g/dL   RDW 87.6 88.3 - 84.5 %   Platelets 204 150 - 450 x10E3/uL   Neutrophils 64 Not Estab. %   Lymphs 22 Not Estab. %   Monocytes 8 Not Estab. %   Eos 4 Not Estab. %   Basos 1 Not Estab. %   Neutrophils Absolute 5.2 1.4 - 7.0 x10E3/uL   Lymphocytes Absolute 1.8 0.7 - 3.1 x10E3/uL   Monocytes Absolute 0.7 0.1 - 0.9 x10E3/uL   EOS (ABSOLUTE) 0.3 0.0 - 0.4 x10E3/uL   Basophils Absolute 0.1 0.0 - 0.2 x10E3/uL   Immature Granulocytes 1 Not Estab. %   Immature Grans (Abs) 0.1 0.0 - 0.1 x10E3/uL  Comprehensive metabolic panel with GFR   Collection Time: 06/15/24  3:01 PM  Result Value Ref Range   Glucose 93 70 - 99 mg/dL   BUN 12 6 - 24 mg/dL   Creatinine, Ser 8.93 0.76 - 1.27 mg/dL   eGFR 84 >40 fO/fpw/8.26    BUN/Creatinine Ratio 11 9 - 20   Sodium 144 134 - 144 mmol/L   Potassium 4.6 3.5 - 5.2 mmol/L   Chloride 100 96 - 106 mmol/L   CO2 24 20 - 29 mmol/L   Calcium  10.6 (H) 8.7 - 10.2 mg/dL   Total Protein 7.7 6.0 - 8.5 g/dL   Albumin 5.0 (H) 3.8 - 4.9 g/dL   Globulin, Total 2.7 1.5 - 4.5 g/dL   Bilirubin Total 0.4 0.0 - 1.2 mg/dL   Alkaline Phosphatase 104 47 - 123 IU/L   AST 79 (H) 0 - 40 IU/L   ALT 219 (HH) 0 - 44 IU/L  Lipid panel   Collection Time: 06/15/24  3:01 PM  Result Value Ref Range   Cholesterol, Total 157 100 - 199 mg/dL   Triglycerides 710 (H) 0 - 149 mg/dL   HDL 51 >60 mg/dL   VLDL Cholesterol Cal 45 (H) 5 - 40 mg/dL   LDL Chol Calc (NIH) 61 0 - 99 mg/dL   Chol/HDL Ratio 3.1 0.0 - 5.0 ratio  .  Assessment & Plan:   Assessment & Plan    Body mass index is 32.71 kg/m.  Assessment and  Plan      No orders of the defined types were placed in this encounter.   No orders of the defined types were placed in this encounter.      Follow-up: No follow-ups on file.  An After Visit Summary was printed and given to the patient.  Harrie CHRISTELLA Cedar, FNP [facility] [phone]

## 2024-06-30 DIAGNOSIS — S63501A Unspecified sprain of right wrist, initial encounter: Secondary | ICD-10-CM | POA: Insufficient documentation

## 2024-06-30 NOTE — Assessment & Plan Note (Signed)
 Hypertension Blood pressure well-controlled. Previous medication adherence issues noted. BP Readings from Last 3 Encounters:  06/29/24 118/76  06/15/24 (!) 152/100  03/11/24 124/76  - Continue current antihypertensive regimen. - Follow-up in two weeks for blood pressure recheck and medication review.

## 2024-06-30 NOTE — Assessment & Plan Note (Signed)
 Alcohol-related liver disease and fatty liver Liver enzymes elevated due to alcohol consumption.  - Advised reduction in alcohol consumption, especially during holidays. - Order liver ultrasound if symptoms or pain develop. - Recheck liver enzymes in February.

## 2024-06-30 NOTE — Assessment & Plan Note (Signed)
 Right wrist sprain Acute sprain with no fracture suspicion. Declined x-ray. - Offer x-ray if symptoms worsen or fracture suspected.

## 2024-06-30 NOTE — Progress Notes (Incomplete)
 Subjective:  Patient ID: David Murphy, male    DOB: 23-Nov-1970  Age: 53 y.o. MRN: [id number]  Chief Complaint  Patient presents with  . Medical Management of Chronic Issues    Discussed the use of AI scribe software for clinical note transcription with the patient, who gave verbal consent to proceed.  History of Present Illness   David Murphy is a 53 year old male with hypertension and elevated liver enzymes who presents for a blood pressure recheck and medication review.  Hypertension - Present for blood pressure recheck and medication review - No headaches, blurred vision, or chest pain - Recent episode of facial flushing while not taking antihypertensive medication correctly - Currently taking prescribed antihypertensive medications as directed  Elevated liver enzymes and hepatic steatosis - History of elevated liver enzymes, previously trending down but recently increased - Attributes recent increase to increased alcohol consumption, particularly beer - Previous liver ultrasound demonstrated fatty liver - No new medications started  Wrist injury - Tripped over an extension cord this morning, resulting in a fall and a sprained wrist - Does not believe the wrist is fractured - Does not feel the need for an x-ray at this time            12/04/2023    2:28 PM 07/29/2023    3:17 PM 04/16/2023    2:49 PM 10/22/2022    2:39 PM 10/16/2021    3:16 PM  Depression screen PHQ 2/9  Decreased Interest 0 0 0 0 0  Down, Depressed, Hopeless 0 0 0 0 0  PHQ - 2 Score 0 0 0 0 0  Altered sleeping  0 0    Tired, decreased energy  0 0    Change in appetite  0 0    Feeling bad or failure about yourself   0 0    Trouble concentrating  0 0    Moving slowly or fidgety/restless  0 0    Suicidal thoughts  0 0    PHQ-9 Score  0  0     Difficult doing work/chores  Not difficult at all Not difficult at all       Data saved with a previous flowsheet row definition        03/11/2024     2:34 PM  Fall Risk   Falls in the past year? 0  Number falls in past yr: 0  Injury with Fall? 0  Risk for fall due to : No Fall Risks  Follow up Falls evaluation completed    Patient Care Team: Teressa Harrie HERO, FNP as PCP - General (Family Medicine) Misenheimer, Evalene, MD as Consulting Physician (Unknown Physician Specialty)   Review of Systems  Constitutional:  Negative for appetite change, fatigue and fever.  HENT:  Negative for congestion, ear pain, sinus pressure and sore throat.   Eyes: Negative.   Respiratory:  Negative for cough, chest tightness, shortness of breath and wheezing.   Cardiovascular:  Negative for chest pain and palpitations.  Gastrointestinal:  Negative for abdominal pain, constipation, diarrhea, nausea and vomiting.  Endocrine: Negative.   Genitourinary:  Negative for dysuria and hematuria.  Musculoskeletal:  Positive for arthralgias (right wrist) and joint swelling. Negative for back pain and myalgias.  Skin:  Negative for rash.  Allergic/Immunologic: Negative.   Neurological:  Negative for dizziness, weakness, light-headedness and headaches.  Hematological: Negative.   Psychiatric/Behavioral:  Negative for dysphoric mood. The patient is not nervous/anxious.     Current Outpatient Medications on  File Prior to Visit  Medication Sig Dispense Refill  . chlorthalidone  (HYGROTON ) 50 MG tablet Take 1 tablet (50 mg total) by mouth daily. 30 tablet 0  . cyclobenzaprine  (FLEXERIL ) 10 MG tablet Take 1 tablet (10 mg total) by mouth 3 (three) times daily as needed. for muscle spams 270 tablet 1  . diltiazem  (CARDIZEM  CD) 120 MG 24 hr capsule Take 1 capsule by mouth once daily 30 capsule 0  . Evolocumab  (REPATHA  SURECLICK) 140 MG/ML SOAJ Inject 140 mg into the skin every 14 (fourteen) days. 6 mL 1  . Krill Oil 500 MG CAPS Take 500 mg by mouth daily.    . naltrexone  (DEPADE) 50 MG tablet Take 1 tablet (50 mg total) by mouth daily. 90 tablet 1  . olmesartan  (BENICAR )  40 MG tablet Take 1 tablet (40 mg total) by mouth daily. 90 tablet 0  . sildenafil  (VIAGRA ) 50 MG tablet TAKE 1 TABLET BY MOUTH ONCE DAILY AS NEEDED FOR ERECTILE DYSFUNCTION 50 tablet 0  . zolpidem  (AMBIEN ) 10 MG tablet Take 1 tablet (10 mg total) by mouth at bedtime. 90 tablet 0   No current facility-administered medications on file prior to visit.   Past Medical History:  Diagnosis Date  . 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 Essential hypertension   . Mixed hyperlipidemia   . Primary insomnia   . Testicular hypofunction    Past Surgical History:  Procedure Laterality Date  . HERNIA REPAIR      Family History  Problem Relation Age of Onset  . Hyperlipidemia Mother   . Diabetes Mother   . Hyperlipidemia Father    Social History   Socioeconomic History  . Marital status: Divorced    Spouse name: Not on file  . Number of children: 1  . Years of education: Not on file  . Highest education level: Not on file  Occupational History  . Occupation: Civil Service [employer]    Comment: [employer]  Tobacco Use  . Smoking status: Never  . Smokeless tobacco: Never  Vaping Use  . Vaping status: Never Used  Substance and Sexual Activity  . Alcohol use: Yes    Alcohol/week: 35.0 standard drinks of alcohol    Types: 35 Cans of beer per week    Comment: Alcoholic, currently not in treatment, drink 4-5 cans of beer daily, some more  . Drug use: Never  . Sexual activity: Not Currently    Partners: Female  Other Topics Concern  . Not on file  Social History Narrative  . Not on file   Social Drivers of Health   Financial Resource Strain: Low Risk  (12/04/2023)   Overall Financial Resource Strain (CARDIA)   . Difficulty of Paying Living Expenses: Not hard at all  Food Insecurity: No Food Insecurity (12/04/2023)   Hunger Vital Sign   . Worried About Programme Researcher, Broadcasting/film/video in the Last Year: Never true   . Ran Out of Food in the Last Year: Never true   Transportation Needs: No Transportation Needs (12/04/2023)   PRAPARE - Transportation   . Lack of Transportation (Medical): No   . Lack of Transportation (Non-Medical): No  Physical Activity: Inactive (12/04/2023)   Exercise Vital Sign   . Days of Exercise per Week: 0 days   . Minutes of Exercise per Session: 0 min  Stress: No Stress Concern Present (12/04/2023)   [employer] of Occupational Health - Occupational Stress Questionnaire   . Feeling of Stress :  Not at all  Social Connections: Socially Isolated (12/04/2023)   Social Connection and Isolation Panel   . Frequency of Communication with Friends and Family: More than three times a week   . Frequency of Social Gatherings with Friends and Family: More than three times a week   . Attends Religious Services: Never   . Active Member of Clubs or Organizations: No   . Attends Banker Meetings: Never   . Marital Status: Divorced    Objective:  BP 118/76   Pulse 93   Temp 98.1 F (36.7 C) (Temporal)   Resp 18   Ht 6' (1.829 m)   Wt 241 lb 3.2 oz (109.4 kg)   SpO2 95%   BMI 32.71 kg/m      06/29/2024    2:23 PM 06/15/2024    2:17 PM 03/11/2024    2:31 PM  BP/Weight  Systolic BP 118 152 124  Diastolic BP 76 100 76  Wt. (Lbs) 241.2 239.8 234  BMI 32.71 kg/m2 32.52 kg/m2 31.74 kg/m2    Physical Exam Vitals reviewed.  Constitutional:      General: He is not in acute distress.    Appearance: Normal appearance. He is obese. He is not ill-appearing.  Eyes:     Conjunctiva/sclera: Conjunctivae normal.  Cardiovascular:     Rate and Rhythm: Normal rate and regular rhythm.     Heart sounds: Normal heart sounds. No murmur heard. Pulmonary:     Effort: Pulmonary effort is normal. No respiratory distress.     Breath sounds: Normal breath sounds. No wheezing.  Abdominal:     Palpations: Abdomen is soft.  Musculoskeletal:        General: Swelling, tenderness and signs of injury (right wrist) present.  Skin:     General: Skin is warm.  Neurological:     Mental Status: He is alert and oriented to person, place, and time. Mental status is at baseline.  Psychiatric:        Mood and Affect: Mood normal.        Behavior: Behavior normal.    Lab Results  Component Value Date   WBC 8.1 06/15/2024   HGB 17.6 06/15/2024   HCT 53.8 (H) 06/15/2024   PLT 204 06/15/2024   GLUCOSE 93 06/15/2024   CHOL 157 06/15/2024   TRIG 289 (H) 06/15/2024   HDL 51 06/15/2024   LDLCALC 61 06/15/2024   ALT 219 (HH) 06/15/2024   AST 79 (H) 06/15/2024   NA 144 06/15/2024   K 4.6 06/15/2024   CL 100 06/15/2024   CREATININE 1.06 06/15/2024   BUN 12 06/15/2024   CO2 24 06/15/2024   TSH 3.400 04/16/2023    Results for orders placed or performed in visit on 06/15/24  CBC with Differential/Platelet   Collection Time: 06/15/24  3:01 PM  Result Value Ref Range   WBC 8.1 3.4 - 10.8 x10E3/uL   RBC 5.77 4.14 - 5.80 x10E6/uL   Hemoglobin 17.6 13.0 - 17.7 g/dL   Hematocrit 46.1 (H) 62.4 - 51.0 %   MCV 93 79 - 97 fL   MCH 30.5 26.6 - 33.0 pg   MCHC 32.7 31.5 - 35.7 g/dL   RDW 87.6 88.3 - 84.5 %   Platelets 204 150 - 450 x10E3/uL   Neutrophils 64 Not Estab. %   Lymphs 22 Not Estab. %   Monocytes 8 Not Estab. %   Eos 4 Not Estab. %   Basos 1 Not Estab. %  Neutrophils Absolute 5.2 1.4 - 7.0 x10E3/uL   Lymphocytes Absolute 1.8 0.7 - 3.1 x10E3/uL   Monocytes Absolute 0.7 0.1 - 0.9 x10E3/uL   EOS (ABSOLUTE) 0.3 0.0 - 0.4 x10E3/uL   Basophils Absolute 0.1 0.0 - 0.2 x10E3/uL   Immature Granulocytes 1 Not Estab. %   Immature Grans (Abs) 0.1 0.0 - 0.1 x10E3/uL  Comprehensive metabolic panel with GFR   Collection Time: 06/15/24  3:01 PM  Result Value Ref Range   Glucose 93 70 - 99 mg/dL   BUN 12 6 - 24 mg/dL   Creatinine, Ser 8.93 0.76 - 1.27 mg/dL   eGFR 84 >40 fO/fpw/8.26   BUN/Creatinine Ratio 11 9 - 20   Sodium 144 134 - 144 mmol/L   Potassium 4.6 3.5 - 5.2 mmol/L   Chloride 100 96 - 106 mmol/L   CO2 24 20 -  29 mmol/L   Calcium  10.6 (H) 8.7 - 10.2 mg/dL   Total Protein 7.7 6.0 - 8.5 g/dL   Albumin 5.0 (H) 3.8 - 4.9 g/dL   Globulin, Total 2.7 1.5 - 4.5 g/dL   Bilirubin Total 0.4 0.0 - 1.2 mg/dL   Alkaline Phosphatase 104 47 - 123 IU/L   AST 79 (H) 0 - 40 IU/L   ALT 219 (HH) 0 - 44 IU/L  Lipid panel   Collection Time: 06/15/24  3:01 PM  Result Value Ref Range   Cholesterol, Total 157 100 - 199 mg/dL   Triglycerides 710 (H) 0 - 149 mg/dL   HDL 51 >60 mg/dL   VLDL Cholesterol Cal 45 (H) 5 - 40 mg/dL   LDL Chol Calc (NIH) 61 0 - 99 mg/dL   Chol/HDL Ratio 3.1 0.0 - 5.0 ratio  .  Assessment & Plan:   Assessment & Plan Essential hypertension           Alcohol-related liver disease and fatty liver Liver enzymes elevated due to alcohol consumption.  - Advised reduction in alcohol consumption, especially during holidays. - Order liver ultrasound if symptoms or pain develop. - Recheck liver enzymes in February.  Hypertension Blood pressure well-controlled. Previous medication adherence issues noted. - Continue current antihypertensive regimen. - Follow-up in two weeks for blood pressure recheck and medication review.  Right wrist sprain Acute sprain with no fracture suspicion. Declined x-ray. - Offer x-ray if symptoms worsen or fracture suspected.         Follow-up: No follow-ups on file.  An After Visit Summary was printed and given to the patient.  Harrie CHRISTELLA Cedar, FNP [facility] [phone]

## 2024-07-06 ENCOUNTER — Other Ambulatory Visit: Payer: Self-pay | Admitting: Family Medicine

## 2024-07-06 DIAGNOSIS — R600 Localized edema: Secondary | ICD-10-CM

## 2024-07-14 ENCOUNTER — Other Ambulatory Visit: Payer: Self-pay | Admitting: Family Medicine

## 2024-07-20 ENCOUNTER — Other Ambulatory Visit: Payer: Self-pay | Admitting: Family Medicine

## 2024-07-20 DIAGNOSIS — G4709 Other insomnia: Secondary | ICD-10-CM

## 2024-07-20 DIAGNOSIS — E291 Testicular hypofunction: Secondary | ICD-10-CM

## 2024-08-06 ENCOUNTER — Other Ambulatory Visit: Payer: Self-pay | Admitting: Family Medicine

## 2024-08-06 DIAGNOSIS — R600 Localized edema: Secondary | ICD-10-CM

## 2024-08-14 ENCOUNTER — Other Ambulatory Visit: Payer: Self-pay | Admitting: Family Medicine

## 2024-09-11 ENCOUNTER — Other Ambulatory Visit: Payer: Self-pay | Admitting: Family Medicine

## 2024-09-18 ENCOUNTER — Other Ambulatory Visit: Payer: Self-pay | Admitting: Family Medicine

## 2024-09-21 ENCOUNTER — Ambulatory Visit: Admitting: Family Medicine
